# Patient Record
Sex: Female | Born: 1945 | Race: White | Hispanic: No | Smoking: Former smoker
Health system: Southern US, Community
[De-identification: ages and names within clinical notes are randomized; demographics above are authoritative.]

## PROBLEM LIST (undated history)

## (undated) DIAGNOSIS — D62 Acute posthemorrhagic anemia: Secondary | ICD-10-CM

## (undated) DIAGNOSIS — I10 Essential (primary) hypertension: Secondary | ICD-10-CM

## (undated) DIAGNOSIS — D126 Benign neoplasm of colon, unspecified: Secondary | ICD-10-CM

## (undated) DIAGNOSIS — Z87440 Personal history of urinary (tract) infections: Secondary | ICD-10-CM

## (undated) DIAGNOSIS — F329 Major depressive disorder, single episode, unspecified: Secondary | ICD-10-CM

## (undated) DIAGNOSIS — F419 Anxiety disorder, unspecified: Secondary | ICD-10-CM

## (undated) DIAGNOSIS — G629 Polyneuropathy, unspecified: Secondary | ICD-10-CM

## (undated) DIAGNOSIS — F1011 Alcohol abuse, in remission: Secondary | ICD-10-CM

## (undated) DIAGNOSIS — K7010 Alcoholic hepatitis without ascites: Secondary | ICD-10-CM

## (undated) DIAGNOSIS — Z87448 Personal history of other diseases of urinary system: Secondary | ICD-10-CM

## (undated) DIAGNOSIS — F32A Depression, unspecified: Secondary | ICD-10-CM

## (undated) DIAGNOSIS — M48061 Spinal stenosis, lumbar region without neurogenic claudication: Secondary | ICD-10-CM

## (undated) DIAGNOSIS — K269 Duodenal ulcer, unspecified as acute or chronic, without hemorrhage or perforation: Secondary | ICD-10-CM

## (undated) DIAGNOSIS — O009 Unspecified ectopic pregnancy without intrauterine pregnancy: Secondary | ICD-10-CM

## (undated) DIAGNOSIS — M5418 Radiculopathy, sacral and sacrococcygeal region: Secondary | ICD-10-CM

## (undated) DIAGNOSIS — N3020 Other chronic cystitis without hematuria: Secondary | ICD-10-CM

## (undated) DIAGNOSIS — N189 Chronic kidney disease, unspecified: Secondary | ICD-10-CM

## (undated) DIAGNOSIS — B3781 Candidal esophagitis: Secondary | ICD-10-CM

## (undated) DIAGNOSIS — D259 Leiomyoma of uterus, unspecified: Secondary | ICD-10-CM

## (undated) HISTORY — PX: OTHER SURGICAL HISTORY: SHX169

## (undated) HISTORY — PX: TONSILLECTOMY AND ADENOIDECTOMY: SUR1326

## (undated) HISTORY — PX: BACK SURGERY: SHX140

## (undated) HISTORY — DX: Essential (primary) hypertension: I10

## (undated) HISTORY — PX: COLONOSCOPY: SHX174

## (undated) HISTORY — PX: OOPHORECTOMY: SHX86

## (undated) HISTORY — DX: Candidal esophagitis: B37.81

## (undated) HISTORY — DX: Personal history of other diseases of urinary system: Z87.448

## (undated) HISTORY — PX: APPENDECTOMY: SHX54

## (undated) HISTORY — DX: Major depressive disorder, single episode, unspecified: F32.9

## (undated) HISTORY — DX: Other chronic cystitis without hematuria: N30.20

## (undated) HISTORY — DX: Unspecified ectopic pregnancy without intrauterine pregnancy: O00.90

## (undated) HISTORY — PX: SALPINGECTOMY: SHX328

## (undated) HISTORY — DX: Duodenal ulcer, unspecified as acute or chronic, without hemorrhage or perforation: K26.9

## (undated) HISTORY — DX: Depression, unspecified: F32.A

## (undated) HISTORY — DX: Radiculopathy, sacral and sacrococcygeal region: M54.18

## (undated) HISTORY — DX: Leiomyoma of uterus, unspecified: D25.9

## (undated) HISTORY — DX: Alcohol abuse, in remission: F10.11

## (undated) HISTORY — DX: Spinal stenosis, lumbar region without neurogenic claudication: M48.061

## (undated) HISTORY — DX: Acute posthemorrhagic anemia: D62

## (undated) HISTORY — DX: Chronic kidney disease, unspecified: N18.9

## (undated) HISTORY — DX: Anxiety disorder, unspecified: F41.9

## (undated) HISTORY — DX: Benign neoplasm of colon, unspecified: D12.6

## (undated) HISTORY — DX: Polyneuropathy, unspecified: G62.9

## (undated) HISTORY — DX: Personal history of urinary (tract) infections: Z87.440

## (undated) HISTORY — DX: Alcoholic hepatitis without ascites: K70.10

---

## 1996-10-20 ENCOUNTER — Encounter: Payer: Self-pay | Admitting: Internal Medicine

## 1996-10-21 ENCOUNTER — Encounter: Payer: Self-pay | Admitting: Internal Medicine

## 1997-10-21 ENCOUNTER — Other Ambulatory Visit: Admission: RE | Admit: 1997-10-21 | Discharge: 1997-10-21 | Payer: Self-pay | Admitting: Obstetrics and Gynecology

## 1998-02-04 ENCOUNTER — Encounter: Payer: Self-pay | Admitting: Internal Medicine

## 1998-11-23 ENCOUNTER — Other Ambulatory Visit: Admission: RE | Admit: 1998-11-23 | Discharge: 1998-11-23 | Payer: Self-pay | Admitting: Obstetrics and Gynecology

## 1999-03-14 ENCOUNTER — Encounter: Admission: RE | Admit: 1999-03-14 | Discharge: 1999-03-14 | Payer: Self-pay | Admitting: Sports Medicine

## 1999-03-14 ENCOUNTER — Encounter: Payer: Self-pay | Admitting: Sports Medicine

## 1999-04-04 ENCOUNTER — Encounter: Admission: RE | Admit: 1999-04-04 | Discharge: 1999-04-04 | Payer: Self-pay | Admitting: Sports Medicine

## 1999-05-09 ENCOUNTER — Encounter: Admission: RE | Admit: 1999-05-09 | Discharge: 1999-05-09 | Payer: Self-pay | Admitting: Sports Medicine

## 1999-11-23 ENCOUNTER — Other Ambulatory Visit: Admission: RE | Admit: 1999-11-23 | Discharge: 1999-11-23 | Payer: Self-pay | Admitting: Obstetrics and Gynecology

## 2000-12-24 ENCOUNTER — Other Ambulatory Visit: Admission: RE | Admit: 2000-12-24 | Discharge: 2000-12-24 | Payer: Self-pay | Admitting: Obstetrics and Gynecology

## 2001-12-26 ENCOUNTER — Other Ambulatory Visit: Admission: RE | Admit: 2001-12-26 | Discharge: 2001-12-26 | Payer: Self-pay | Admitting: Obstetrics and Gynecology

## 2003-05-06 ENCOUNTER — Ambulatory Visit (HOSPITAL_BASED_OUTPATIENT_CLINIC_OR_DEPARTMENT_OTHER): Admission: RE | Admit: 2003-05-06 | Discharge: 2003-05-06 | Payer: Self-pay | Admitting: Internal Medicine

## 2003-10-13 ENCOUNTER — Other Ambulatory Visit: Admission: RE | Admit: 2003-10-13 | Discharge: 2003-10-13 | Payer: Self-pay | Admitting: Obstetrics and Gynecology

## 2004-05-20 ENCOUNTER — Ambulatory Visit: Payer: Self-pay | Admitting: Internal Medicine

## 2004-05-21 ENCOUNTER — Encounter: Payer: Self-pay | Admitting: Internal Medicine

## 2004-05-21 ENCOUNTER — Ambulatory Visit (HOSPITAL_COMMUNITY): Admission: RE | Admit: 2004-05-21 | Discharge: 2004-05-21 | Payer: Self-pay | Admitting: Internal Medicine

## 2004-06-06 ENCOUNTER — Ambulatory Visit: Payer: Self-pay | Admitting: Internal Medicine

## 2004-10-06 ENCOUNTER — Ambulatory Visit: Payer: Self-pay | Admitting: Internal Medicine

## 2004-10-12 ENCOUNTER — Ambulatory Visit: Payer: Self-pay | Admitting: Internal Medicine

## 2004-10-14 ENCOUNTER — Other Ambulatory Visit: Admission: RE | Admit: 2004-10-14 | Discharge: 2004-10-14 | Payer: Self-pay | Admitting: Addiction Medicine

## 2005-10-26 ENCOUNTER — Other Ambulatory Visit: Admission: RE | Admit: 2005-10-26 | Discharge: 2005-10-26 | Payer: Self-pay | Admitting: Obstetrics and Gynecology

## 2006-01-16 ENCOUNTER — Ambulatory Visit: Payer: Self-pay | Admitting: Internal Medicine

## 2006-03-27 DIAGNOSIS — F1011 Alcohol abuse, in remission: Secondary | ICD-10-CM

## 2006-03-27 HISTORY — DX: Alcohol abuse, in remission: F10.11

## 2006-04-11 ENCOUNTER — Inpatient Hospital Stay (HOSPITAL_COMMUNITY): Admission: AD | Admit: 2006-04-11 | Discharge: 2006-04-17 | Payer: Self-pay | Admitting: Internal Medicine

## 2006-04-11 ENCOUNTER — Ambulatory Visit: Payer: Self-pay | Admitting: Internal Medicine

## 2006-04-11 ENCOUNTER — Encounter (INDEPENDENT_AMBULATORY_CARE_PROVIDER_SITE_OTHER): Payer: Self-pay | Admitting: *Deleted

## 2006-04-12 ENCOUNTER — Encounter: Payer: Self-pay | Admitting: Internal Medicine

## 2006-04-16 ENCOUNTER — Ambulatory Visit: Payer: Self-pay | Admitting: Internal Medicine

## 2006-07-03 ENCOUNTER — Ambulatory Visit: Payer: Self-pay | Admitting: Internal Medicine

## 2006-07-03 LAB — CONVERTED CEMR LAB
Alkaline Phosphatase: 56 units/L (ref 39–117)
Basophils Relative: 0.9 % (ref 0.0–1.0)
Eosinophils Relative: 1.8 % (ref 0.0–5.0)
HCT: 41 % (ref 36.0–46.0)
Hemoglobin: 14.6 g/dL (ref 12.0–15.0)
Lymphocytes Relative: 25 % (ref 12.0–46.0)
Monocytes Absolute: 0.5 10*3/uL (ref 0.2–0.7)
Neutro Abs: 6 10*3/uL (ref 1.4–7.7)
Neutrophils Relative %: 66.4 % (ref 43.0–77.0)
Total Bilirubin: 0.8 mg/dL (ref 0.3–1.2)
Total Protein: 6.8 g/dL (ref 6.0–8.3)
WBC: 9.1 10*3/uL (ref 4.5–10.5)

## 2006-10-16 ENCOUNTER — Other Ambulatory Visit: Admission: RE | Admit: 2006-10-16 | Discharge: 2006-10-16 | Payer: Self-pay | Admitting: Obstetrics and Gynecology

## 2007-01-29 ENCOUNTER — Telehealth: Payer: Self-pay | Admitting: Internal Medicine

## 2007-03-23 ENCOUNTER — Emergency Department (HOSPITAL_COMMUNITY): Admission: EM | Admit: 2007-03-23 | Discharge: 2007-03-23 | Payer: Self-pay | Admitting: Emergency Medicine

## 2007-03-27 ENCOUNTER — Encounter: Admission: RE | Admit: 2007-03-27 | Discharge: 2007-03-27 | Payer: Self-pay | Admitting: Neurological Surgery

## 2007-04-15 ENCOUNTER — Encounter: Admission: RE | Admit: 2007-04-15 | Discharge: 2007-04-15 | Payer: Self-pay | Admitting: Neurological Surgery

## 2007-05-26 HISTORY — PX: LUMBAR FUSION: SHX111

## 2007-05-27 ENCOUNTER — Inpatient Hospital Stay (HOSPITAL_COMMUNITY): Admission: RE | Admit: 2007-05-27 | Discharge: 2007-05-31 | Payer: Self-pay | Admitting: Neurological Surgery

## 2007-05-27 ENCOUNTER — Ambulatory Visit: Payer: Self-pay | Admitting: *Deleted

## 2007-06-12 ENCOUNTER — Encounter: Admission: RE | Admit: 2007-06-12 | Discharge: 2007-06-12 | Payer: Self-pay | Admitting: Neurological Surgery

## 2007-07-12 ENCOUNTER — Ambulatory Visit: Payer: Self-pay | Admitting: Internal Medicine

## 2007-07-12 ENCOUNTER — Telehealth: Payer: Self-pay | Admitting: Internal Medicine

## 2007-07-12 DIAGNOSIS — IMO0002 Reserved for concepts with insufficient information to code with codable children: Secondary | ICD-10-CM | POA: Insufficient documentation

## 2007-07-12 DIAGNOSIS — R209 Unspecified disturbances of skin sensation: Secondary | ICD-10-CM | POA: Insufficient documentation

## 2007-07-12 DIAGNOSIS — F341 Dysthymic disorder: Secondary | ICD-10-CM | POA: Insufficient documentation

## 2007-07-14 ENCOUNTER — Encounter: Payer: Self-pay | Admitting: Internal Medicine

## 2007-07-22 ENCOUNTER — Ambulatory Visit: Payer: Self-pay | Admitting: Internal Medicine

## 2007-08-02 ENCOUNTER — Telehealth: Payer: Self-pay | Admitting: Internal Medicine

## 2007-08-09 ENCOUNTER — Encounter: Payer: Self-pay | Admitting: Internal Medicine

## 2007-08-15 ENCOUNTER — Encounter: Payer: Self-pay | Admitting: Internal Medicine

## 2007-08-27 ENCOUNTER — Ambulatory Visit: Payer: Self-pay | Admitting: Psychology

## 2007-09-10 ENCOUNTER — Ambulatory Visit: Payer: Self-pay | Admitting: Psychology

## 2007-09-24 ENCOUNTER — Ambulatory Visit: Payer: Self-pay | Admitting: Psychology

## 2007-09-25 HISTORY — PX: MYOMECTOMY: SHX85

## 2007-10-09 ENCOUNTER — Encounter: Payer: Self-pay | Admitting: Internal Medicine

## 2007-10-10 ENCOUNTER — Encounter: Payer: Self-pay | Admitting: Obstetrics and Gynecology

## 2007-10-10 ENCOUNTER — Ambulatory Visit (HOSPITAL_BASED_OUTPATIENT_CLINIC_OR_DEPARTMENT_OTHER): Admission: RE | Admit: 2007-10-10 | Discharge: 2007-10-10 | Payer: Self-pay | Admitting: Obstetrics and Gynecology

## 2007-10-17 ENCOUNTER — Other Ambulatory Visit: Admission: RE | Admit: 2007-10-17 | Discharge: 2007-10-17 | Payer: Self-pay | Admitting: Obstetrics and Gynecology

## 2007-10-22 ENCOUNTER — Ambulatory Visit: Payer: Self-pay | Admitting: Psychology

## 2007-11-19 ENCOUNTER — Ambulatory Visit: Payer: Self-pay | Admitting: Psychology

## 2007-11-20 ENCOUNTER — Telehealth: Payer: Self-pay | Admitting: Internal Medicine

## 2007-11-28 ENCOUNTER — Ambulatory Visit: Payer: Self-pay | Admitting: Internal Medicine

## 2007-11-28 DIAGNOSIS — F1010 Alcohol abuse, uncomplicated: Secondary | ICD-10-CM | POA: Insufficient documentation

## 2007-12-11 ENCOUNTER — Ambulatory Visit: Payer: Self-pay | Admitting: Obstetrics and Gynecology

## 2007-12-11 ENCOUNTER — Other Ambulatory Visit: Admission: RE | Admit: 2007-12-11 | Discharge: 2007-12-11 | Payer: Self-pay | Admitting: Obstetrics and Gynecology

## 2007-12-17 ENCOUNTER — Ambulatory Visit: Payer: Self-pay | Admitting: Psychology

## 2008-01-08 ENCOUNTER — Encounter: Payer: Self-pay | Admitting: Internal Medicine

## 2008-01-14 ENCOUNTER — Ambulatory Visit: Payer: Self-pay | Admitting: Psychology

## 2008-02-11 ENCOUNTER — Ambulatory Visit: Payer: Self-pay | Admitting: Psychology

## 2008-02-11 ENCOUNTER — Ambulatory Visit: Payer: Self-pay | Admitting: Internal Medicine

## 2008-04-07 ENCOUNTER — Ambulatory Visit: Payer: Self-pay | Admitting: Psychology

## 2008-04-08 ENCOUNTER — Encounter: Payer: Self-pay | Admitting: Internal Medicine

## 2008-04-12 IMAGING — CR DG ABDOMEN ACUTE W/ 1V CHEST
3 series · 3 of 3 positions shown · non-contrast
Comparison: None.

CLINICAL DATA: 60 year-old-female with weakness, dehydration, vomiting. 
 ACUTE ABDOMEN WITH CHEST:

[w chest pa *]
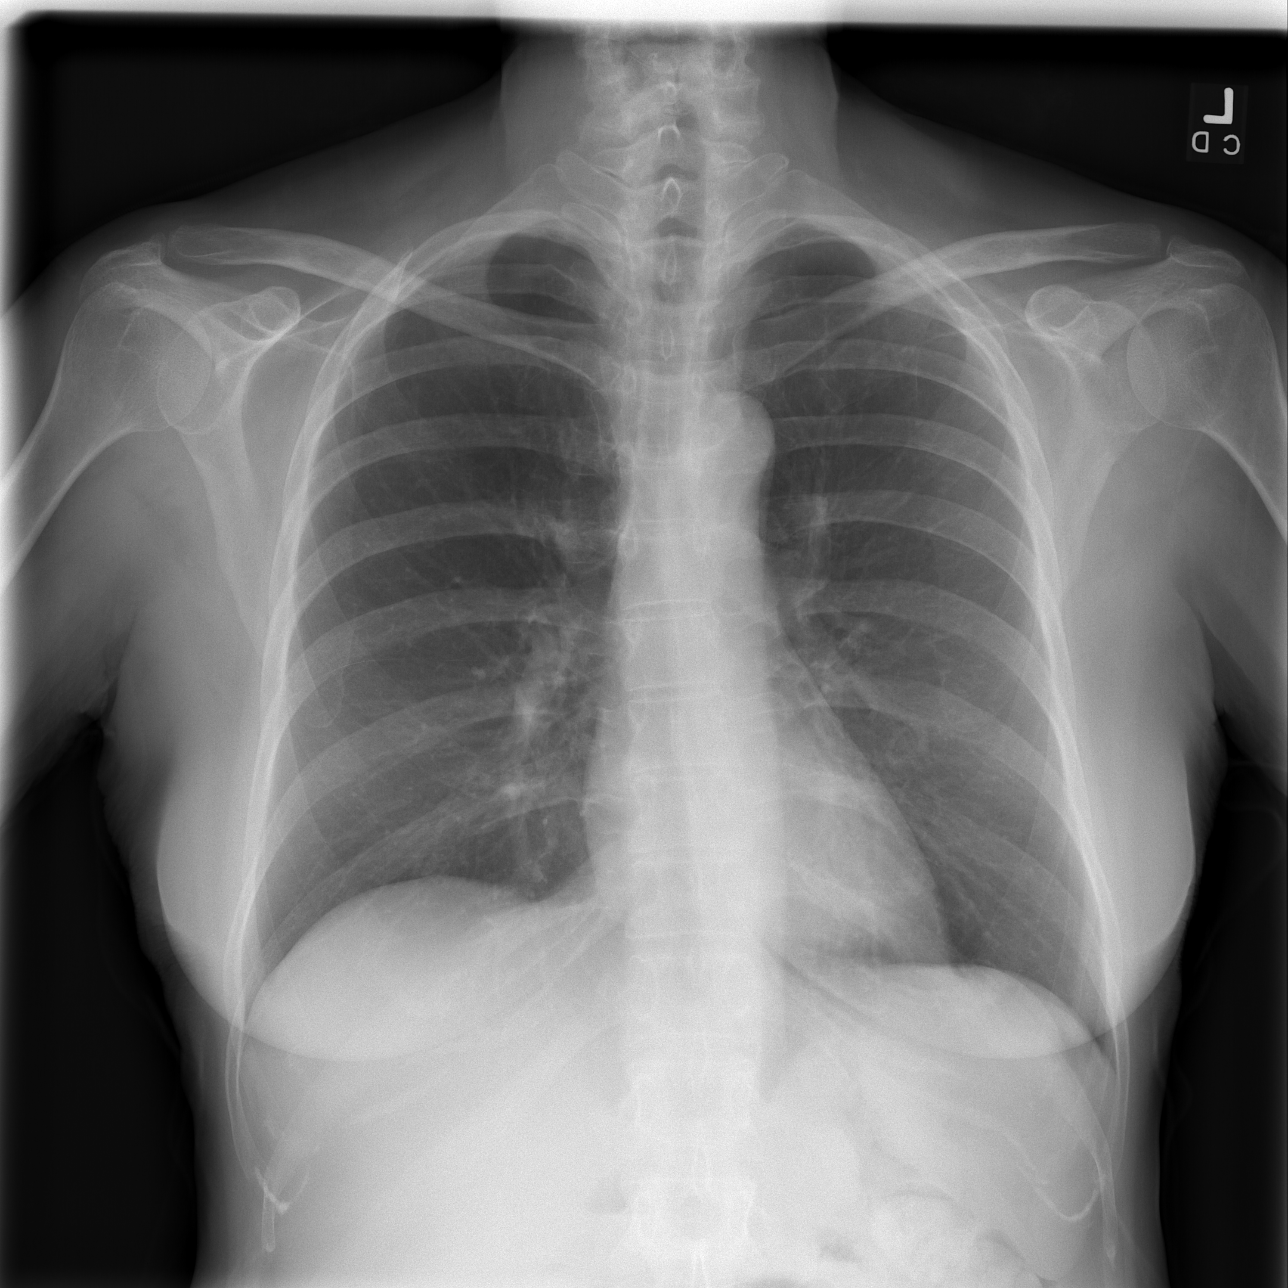

[w abdomen upright *]
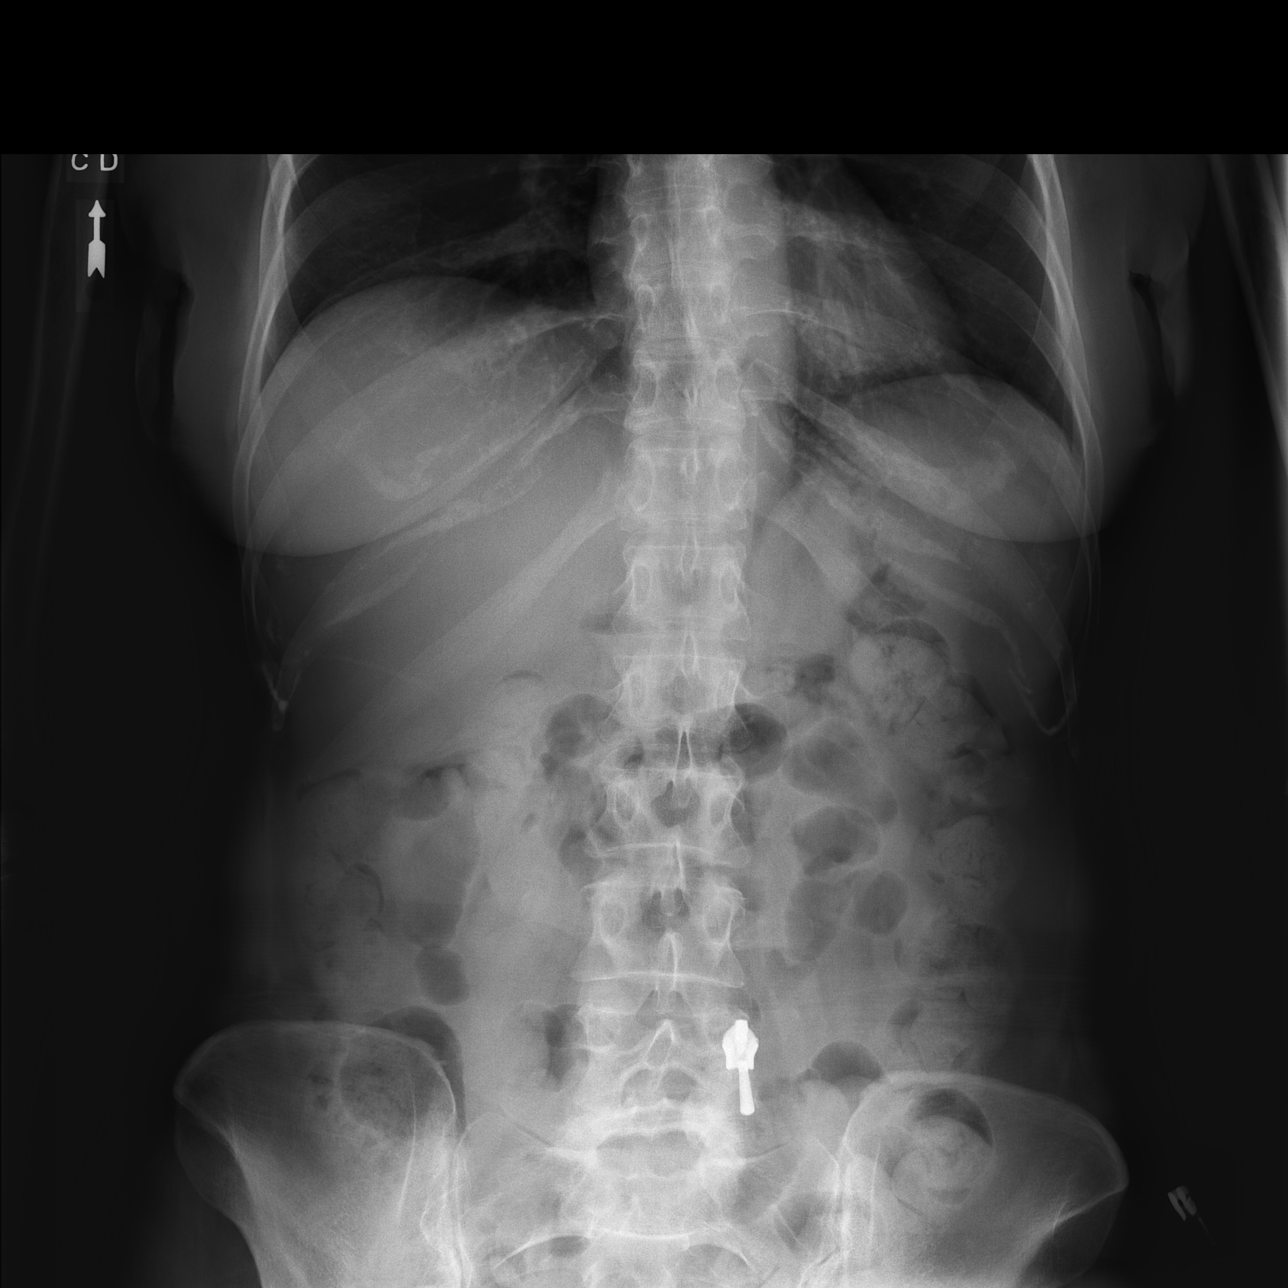

[t abdomen supine]
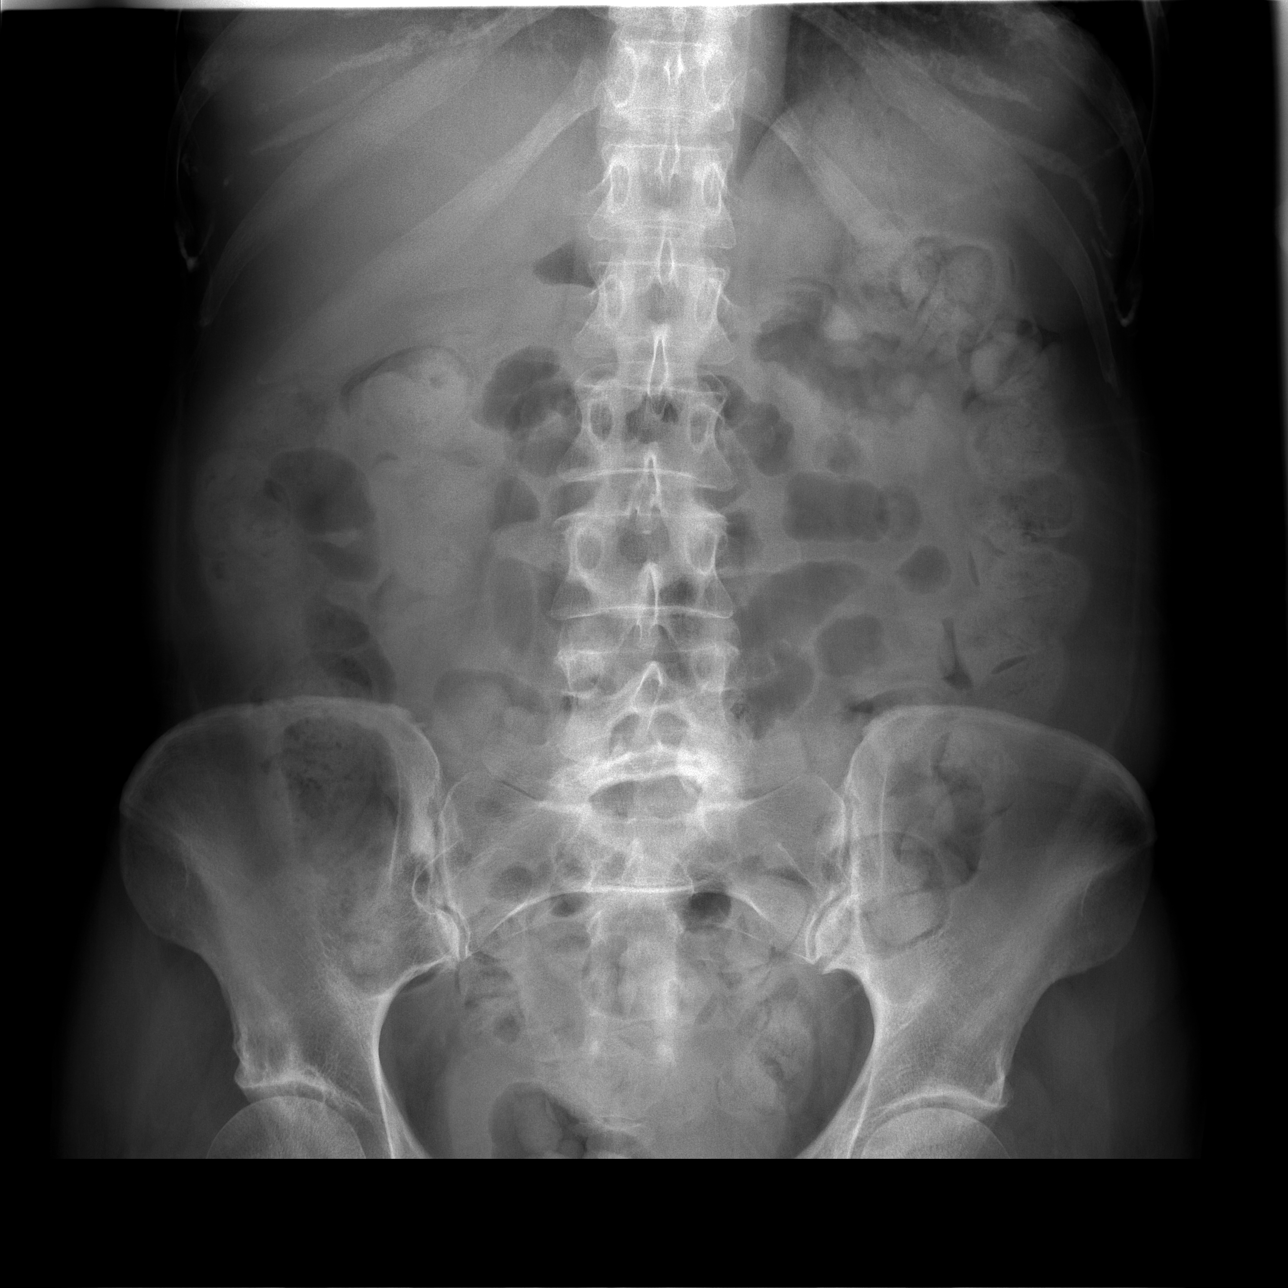

[3 of 3 positions shown; findings below may reference images not displayed]

FINDINGS: A single upright view of the chest demonstrates the cardiac silhouette, mediastinal and hilar contours to be within normal limits.  No acute pulmonary findings. 
 Two views of the abdomen demonstrate a moderate amount of stool throughout the colon suggesting constipation.  There are scattered loops of small bowel with air but no distention or air fluid levels.  No free air.  Soft tissue shadows of the abdomen are grossly maintained.  Bony structures are intact.
IMPRESSION: 1. No acute cardiopulmonary findings. 
 2.  Moderate constipation.

## 2008-06-15 ENCOUNTER — Ambulatory Visit: Payer: Self-pay | Admitting: Psychology

## 2008-06-18 ENCOUNTER — Telehealth: Payer: Self-pay | Admitting: Internal Medicine

## 2008-07-08 ENCOUNTER — Ambulatory Visit: Payer: Self-pay | Admitting: Psychology

## 2008-08-12 ENCOUNTER — Ambulatory Visit: Payer: Self-pay | Admitting: Psychology

## 2008-09-13 ENCOUNTER — Telehealth: Payer: Self-pay | Admitting: Internal Medicine

## 2008-09-15 ENCOUNTER — Ambulatory Visit: Payer: Self-pay | Admitting: Internal Medicine

## 2008-09-16 LAB — CONVERTED CEMR LAB
Alkaline Phosphatase: 63 units/L (ref 39–117)
BUN: 11 mg/dL (ref 6–23)
Basophils Absolute: 0 10*3/uL (ref 0.0–0.1)
Basophils Relative: 0 % (ref 0.0–3.0)
Bilirubin, Direct: 0.2 mg/dL (ref 0.0–0.3)
CO2: 28 meq/L (ref 19–32)
Chloride: 102 meq/L (ref 96–112)
Creatinine, Ser: 0.8 mg/dL (ref 0.4–1.2)
Eosinophils Absolute: 0 10*3/uL (ref 0.0–0.7)
HDL: 102 mg/dL (ref >39.00)
Lymphocytes Relative: 10.6 % — ABNORMAL LOW (ref 12.0–46.0)
MCHC: 35 g/dL (ref 30.0–36.0)
Neutrophils Relative %: 83.7 % — ABNORMAL HIGH (ref 43.0–77.0)
Nitrite: NEGATIVE
RBC: 4.53 M/uL (ref 3.87–5.11)
Total CHOL/HDL Ratio: 2
Total Protein: 7.2 g/dL (ref 6.0–8.3)
Urobilinogen, UA: 0.2 (ref 0.0–1.0)
VLDL: 19.6 mg/dL (ref 0.0–40.0)

## 2008-09-24 ENCOUNTER — Ambulatory Visit: Payer: Self-pay | Admitting: Internal Medicine

## 2008-09-24 LAB — CONVERTED CEMR LAB: Pap Smear: NORMAL

## 2008-09-26 LAB — CONVERTED CEMR LAB
Bilirubin Urine: NEGATIVE
Hemoglobin, Urine: NEGATIVE
Ketones, ur: NEGATIVE mg/dL
Leukocytes, UA: NEGATIVE
Nitrite: NEGATIVE
Specific Gravity, Urine: <=1.005
Total Protein, Urine: NEGATIVE mg/dL
Urine Glucose: NEGATIVE mg/dL
Urobilinogen, UA: 0.2
pH: 6

## 2008-10-01 ENCOUNTER — Other Ambulatory Visit: Admission: RE | Admit: 2008-10-01 | Discharge: 2008-10-01 | Payer: Self-pay | Admitting: Obstetrics and Gynecology

## 2008-10-01 ENCOUNTER — Encounter: Payer: Self-pay | Admitting: Obstetrics and Gynecology

## 2008-10-01 ENCOUNTER — Ambulatory Visit: Payer: Self-pay | Admitting: Obstetrics and Gynecology

## 2008-10-20 ENCOUNTER — Ambulatory Visit: Payer: Self-pay | Admitting: Obstetrics and Gynecology

## 2008-11-05 ENCOUNTER — Ambulatory Visit: Payer: Self-pay | Admitting: Internal Medicine

## 2008-11-05 LAB — CONVERTED CEMR LAB
ALT: 12 units/L (ref 0–35)
AST: 21 units/L (ref 0–37)
Bilirubin, Direct: 0.2 mg/dL (ref 0.0–0.3)
Hemoglobin, Urine: NEGATIVE
Total Bilirubin: 0.8 mg/dL (ref 0.3–1.2)
Total Protein, Urine: NEGATIVE mg/dL
Urine Glucose: NEGATIVE mg/dL
pH: 6 (ref 5.0–8.0)

## 2009-01-21 ENCOUNTER — Telehealth: Payer: Self-pay | Admitting: Internal Medicine

## 2009-02-04 ENCOUNTER — Ambulatory Visit: Payer: Self-pay | Admitting: Internal Medicine

## 2009-02-04 LAB — CONVERTED CEMR LAB
ALT: 15 units/L (ref 0–35)
AST: 23 units/L (ref 0–37)
Alkaline Phosphatase: 59 units/L (ref 39–117)
Bilirubin, Direct: 0.1 mg/dL (ref 0.0–0.3)
Total Bilirubin: 1 mg/dL (ref 0.3–1.2)

## 2009-02-17 ENCOUNTER — Telehealth: Payer: Self-pay | Admitting: Internal Medicine

## 2009-02-24 LAB — HM MAMMOGRAPHY: HM Mammogram: NORMAL

## 2009-02-26 ENCOUNTER — Encounter: Payer: Self-pay | Admitting: Internal Medicine

## 2009-05-10 ENCOUNTER — Telehealth: Payer: Self-pay | Admitting: Internal Medicine

## 2009-05-10 ENCOUNTER — Telehealth (INDEPENDENT_AMBULATORY_CARE_PROVIDER_SITE_OTHER): Payer: Self-pay | Admitting: *Deleted

## 2009-05-28 IMAGING — CR DG OR LOCAL ABDOMEN
1 series · 1 of 1 positions shown · non-contrast
Comparison: none

CLINICAL DATA: Anterior lower lumbar fusion.
 DIAGNOSTIC PORTABLE SUPINE ABDOMEN ? 05/27/07:

[AP]
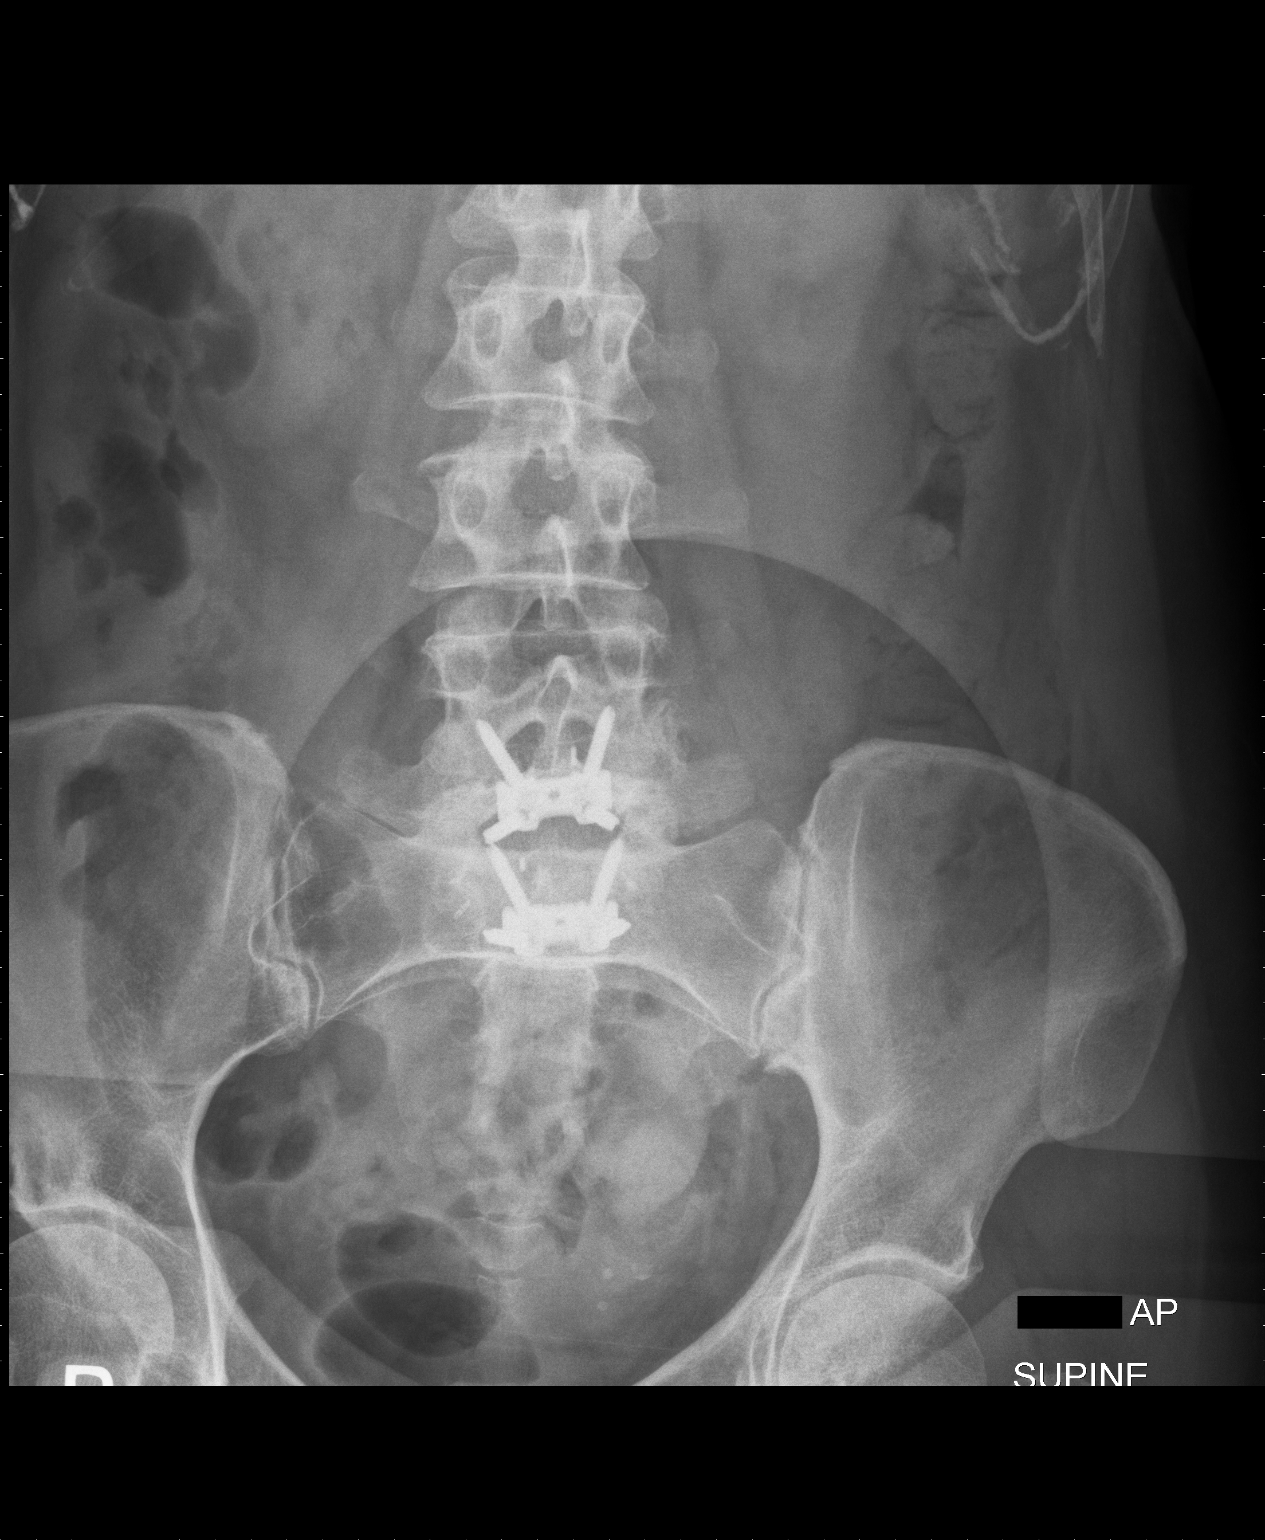

[1 of 1 positions shown; findings below may reference images not displayed]

FINDINGS: Fusion hardware is noted at L4-5 and L5-S1.  Hardware is noted at both of these levels.  No unexpected radiopaque foreign body.  Nonspecific bowel gas pattern.  No dilatation or obstruction.
IMPRESSION: Fusion hardware at the lumbosacral junction.   No unexpected radiopaque foreign body.

## 2009-06-08 ENCOUNTER — Telehealth: Payer: Self-pay | Admitting: Internal Medicine

## 2009-06-09 ENCOUNTER — Encounter: Payer: Self-pay | Admitting: Internal Medicine

## 2009-06-09 ENCOUNTER — Ambulatory Visit: Payer: Self-pay | Admitting: Internal Medicine

## 2009-06-09 LAB — CONVERTED CEMR LAB
Bilirubin Urine: NEGATIVE
Total Protein, Urine: NEGATIVE mg/dL
Urine Glucose: NEGATIVE mg/dL
pH: 5.5 (ref 5.0–8.0)

## 2009-06-10 ENCOUNTER — Telehealth: Payer: Self-pay | Admitting: Internal Medicine

## 2009-06-22 ENCOUNTER — Ambulatory Visit: Payer: Self-pay | Admitting: Cardiology

## 2009-06-22 DIAGNOSIS — I10 Essential (primary) hypertension: Secondary | ICD-10-CM | POA: Insufficient documentation

## 2009-06-22 DIAGNOSIS — I4949 Other premature depolarization: Secondary | ICD-10-CM | POA: Insufficient documentation

## 2009-07-07 ENCOUNTER — Ambulatory Visit: Payer: Self-pay | Admitting: Internal Medicine

## 2009-07-09 ENCOUNTER — Telehealth (INDEPENDENT_AMBULATORY_CARE_PROVIDER_SITE_OTHER): Payer: Self-pay | Admitting: *Deleted

## 2009-07-22 ENCOUNTER — Ambulatory Visit: Payer: Self-pay | Admitting: Cardiology

## 2009-07-22 ENCOUNTER — Ambulatory Visit: Payer: Self-pay

## 2009-07-22 ENCOUNTER — Encounter: Payer: Self-pay | Admitting: Cardiology

## 2009-07-22 ENCOUNTER — Ambulatory Visit (HOSPITAL_COMMUNITY): Admission: RE | Admit: 2009-07-22 | Discharge: 2009-07-22 | Payer: Self-pay | Admitting: Cardiology

## 2009-07-26 ENCOUNTER — Encounter: Payer: Self-pay | Admitting: Cardiology

## 2009-08-09 ENCOUNTER — Ambulatory Visit: Payer: Self-pay | Admitting: Internal Medicine

## 2009-08-10 ENCOUNTER — Telehealth: Payer: Self-pay | Admitting: Cardiology

## 2009-08-11 ENCOUNTER — Telehealth: Payer: Self-pay | Admitting: Internal Medicine

## 2009-08-15 ENCOUNTER — Encounter: Payer: Self-pay | Admitting: Internal Medicine

## 2009-08-16 LAB — CONVERTED CEMR LAB
ALT: 16 units/L (ref 0–35)
AST: 24 units/L (ref 0–37)
Albumin: 4.2 g/dL (ref 3.5–5.2)
Total Bilirubin: 0.5 mg/dL (ref 0.3–1.2)
Vitamin B-12: 361 pg/mL (ref 211–911)

## 2009-08-26 ENCOUNTER — Ambulatory Visit: Payer: Self-pay | Admitting: Cardiology

## 2009-08-27 ENCOUNTER — Telehealth: Payer: Self-pay | Admitting: Cardiology

## 2010-01-20 ENCOUNTER — Ambulatory Visit: Payer: Self-pay | Admitting: Internal Medicine

## 2010-01-20 LAB — CONVERTED CEMR LAB
Albumin: 4.2 g/dL (ref 3.5–5.2)
Basophils Relative: 0.4 % (ref 0.0–3.0)
Eosinophils Relative: 0.5 % (ref 0.0–5.0)
HCT: 38.2 % (ref 36.0–46.0)
Hemoglobin: 13.1 g/dL (ref 12.0–15.0)
Lymphs Abs: 1 10*3/uL (ref 0.7–4.0)
MCV: 103.2 fL — ABNORMAL HIGH (ref 78.0–100.0)
Monocytes Absolute: 0.4 10*3/uL (ref 0.1–1.0)
Monocytes Relative: 9.2 % (ref 3.0–12.0)
Neutro Abs: 3.3 10*3/uL (ref 1.4–7.7)
Prothrombin Time: 9.6 s — ABNORMAL LOW (ref 9.7–11.8)
Total Bilirubin: 0.6 mg/dL (ref 0.3–1.2)
WBC: 4.8 10*3/uL (ref 4.5–10.5)

## 2010-01-21 ENCOUNTER — Encounter (INDEPENDENT_AMBULATORY_CARE_PROVIDER_SITE_OTHER): Payer: Self-pay | Admitting: *Deleted

## 2010-01-21 DIAGNOSIS — Z87448 Personal history of other diseases of urinary system: Secondary | ICD-10-CM | POA: Insufficient documentation

## 2010-01-21 DIAGNOSIS — K589 Irritable bowel syndrome without diarrhea: Secondary | ICD-10-CM | POA: Insufficient documentation

## 2010-01-21 DIAGNOSIS — G4730 Sleep apnea, unspecified: Secondary | ICD-10-CM | POA: Insufficient documentation

## 2010-01-21 DIAGNOSIS — B3781 Candidal esophagitis: Secondary | ICD-10-CM | POA: Insufficient documentation

## 2010-01-21 DIAGNOSIS — K7689 Other specified diseases of liver: Secondary | ICD-10-CM | POA: Insufficient documentation

## 2010-01-21 DIAGNOSIS — Z8719 Personal history of other diseases of the digestive system: Secondary | ICD-10-CM | POA: Insufficient documentation

## 2010-01-21 LAB — CONVERTED CEMR LAB
Specific Gravity, Urine: 1.025 (ref 1.000–1.030)
Total Protein, Urine: NEGATIVE mg/dL
Urine Glucose: NEGATIVE mg/dL
Urobilinogen, UA: 2 (ref 0.0–1.0)
pH: 6 (ref 5.0–8.0)

## 2010-01-27 ENCOUNTER — Ambulatory Visit: Payer: Self-pay | Admitting: Internal Medicine

## 2010-01-31 ENCOUNTER — Telehealth (INDEPENDENT_AMBULATORY_CARE_PROVIDER_SITE_OTHER): Payer: Self-pay | Admitting: *Deleted

## 2010-02-03 ENCOUNTER — Ambulatory Visit: Payer: Self-pay | Admitting: Internal Medicine

## 2010-02-07 LAB — CONVERTED CEMR LAB
Urine Glucose: NEGATIVE mg/dL
Urobilinogen, UA: 0.2 (ref 0.0–1.0)

## 2010-02-21 ENCOUNTER — Ambulatory Visit: Payer: Self-pay | Admitting: Internal Medicine

## 2010-02-22 LAB — CONVERTED CEMR LAB
ALT: 62 units/L — ABNORMAL HIGH (ref 0–35)
AST: 82 units/L — ABNORMAL HIGH (ref 0–37)
Bilirubin, Direct: 0.1 mg/dL (ref 0.0–0.3)
Eosinophils Absolute: 0 10*3/uL (ref 0.0–0.7)
INR: 0.9 (ref 0.8–1.0)
MCHC: 35 g/dL (ref 30.0–36.0)
MCV: 101.5 fL — ABNORMAL HIGH (ref 78.0–100.0)
Monocytes Absolute: 0.5 10*3/uL (ref 0.1–1.0)
Neutrophils Relative %: 67.1 % (ref 43.0–77.0)
Platelets: 286 10*3/uL (ref 150.0–400.0)
Prothrombin Time: 9.6 s — ABNORMAL LOW (ref 9.7–11.8)
Total Bilirubin: 0.4 mg/dL (ref 0.3–1.2)
Total Protein: 7.6 g/dL (ref 6.0–8.3)
WBC: 6.7 10*3/uL (ref 4.5–10.5)

## 2010-03-28 ENCOUNTER — Ambulatory Visit: Payer: Self-pay | Admitting: Internal Medicine

## 2010-04-04 ENCOUNTER — Ambulatory Visit: Admit: 2010-04-04 | Payer: Self-pay | Admitting: Internal Medicine

## 2010-04-05 ENCOUNTER — Telehealth: Payer: Self-pay | Admitting: Internal Medicine

## 2010-04-06 ENCOUNTER — Encounter: Payer: Self-pay | Admitting: Internal Medicine

## 2010-04-06 ENCOUNTER — Telehealth: Payer: Self-pay | Admitting: Internal Medicine

## 2010-04-24 LAB — CONVERTED CEMR LAB
ALT: 55 units/L — ABNORMAL HIGH (ref 0–35)
AST: 53 units/L — ABNORMAL HIGH (ref 0–37)
Albumin: 4.7 g/dL (ref 3.5–5.2)
BUN: 17 mg/dL (ref 6–23)
Basophils Absolute: 0 10*3/uL (ref 0.0–0.1)
CO2: 30 meq/L (ref 19–32)
Chloride: 99 meq/L (ref 96–112)
GGT: 61 units/L — ABNORMAL HIGH (ref 7–51)
Glucose, Bld: 96 mg/dL (ref 70–99)
HCT: 45.1 % (ref 36.0–46.0)
Hemoglobin: 15.2 g/dL — ABNORMAL HIGH (ref 12.0–15.0)
Lymphs Abs: 2 10*3/uL (ref 0.7–4.0)
MCV: 102.5 fL — ABNORMAL HIGH (ref 78.0–100.0)
Monocytes Absolute: 0.6 10*3/uL (ref 0.1–1.0)
Monocytes Relative: 5.8 % (ref 3.0–12.0)
Neutro Abs: 7 10*3/uL (ref 1.4–7.7)
Platelets: 254 10*3/uL (ref 150.0–400.0)
Potassium: 4.3 meq/L (ref 3.5–5.1)
RDW: 12.9 % (ref 11.5–14.6)
Sodium: 137 meq/L (ref 135–145)
TSH: 1.97 microintl units/mL (ref 0.35–5.50)
Total Bilirubin: 1.2 mg/dL (ref 0.3–1.2)

## 2010-04-28 ENCOUNTER — Encounter: Payer: Self-pay | Admitting: Internal Medicine

## 2010-04-28 ENCOUNTER — Other Ambulatory Visit: Payer: Medicare Other

## 2010-04-28 ENCOUNTER — Ambulatory Visit: Payer: Medicare Other | Admitting: Internal Medicine

## 2010-04-28 ENCOUNTER — Encounter (INDEPENDENT_AMBULATORY_CARE_PROVIDER_SITE_OTHER): Payer: Self-pay | Admitting: *Deleted

## 2010-04-28 DIAGNOSIS — F1010 Alcohol abuse, uncomplicated: Secondary | ICD-10-CM

## 2010-04-28 DIAGNOSIS — F341 Dysthymic disorder: Secondary | ICD-10-CM

## 2010-04-28 DIAGNOSIS — I10 Essential (primary) hypertension: Secondary | ICD-10-CM

## 2010-04-28 DIAGNOSIS — K7689 Other specified diseases of liver: Secondary | ICD-10-CM

## 2010-04-28 NOTE — Progress Notes (Signed)
  Phone Note Call from Patient Call back at [phone]   Caller: Patient Call For: Dr. Juanda Chance Summary of Call: Asking to speak directly to Dr. Juanda Chance...would not leave any details Initial call taken by: Karna Christmas,  January 31, 2010 2:33 PM    [Prescriptions]  Received a call from patient's daughter Amil Amen. Explained to daughter that I cannot give her any information about her mother and Dr. Juanda Chance cannot discuss her mother with her.

## 2010-04-28 NOTE — Miscellaneous (Signed)
Summary: Cozaar  Clinical Lists Changes  Medications: Added new medication of COZAAR 50 MG TABS (LOSARTAN POTASSIUM) take one tab by mouth once daily - Signed Rx of COZAAR 50 MG TABS (LOSARTAN POTASSIUM) take one tab by mouth once daily;  #30 x 6;  Signed;  Entered by: Sherri Rad, RN, BSN;  Authorized by: Lenoria Farrier, MD, [facility];  Method used: Electronically to [facility] [address] [place], [place]  [address], Ph: [phone] or [phone], Fax: [phone]    Prescriptions: COZAAR 50 MG TABS (LOSARTAN POTASSIUM) take one tab by mouth once daily  #30 x 6   Entered by:   Sherri Rad, RN, BSN   Authorized by:   Lenoria Farrier, MD, [facility]   Signed by:   Sherri Rad, RN, BSN on 07/26/2009   Method used:   Electronically to        [facility]* (retail)       [address]       [place], [place]  [address]       Ph: [phone] or [phone]       Fax: [phone]   RxID:   [id number]

## 2010-04-28 NOTE — Progress Notes (Signed)
Summary: Lorazepam  Phone Note Refill Request Message from:  Fax from Pharmacy on Aug 11, 2009 11:37 AM  Refills Requested: Medication #1:  ATIVAN 0.5 MG  TABS 1 q 6 as needed anxiety & tremors stop valium  # 100   Last Refilled: 07/12/2009 Leonie Douglas [phone] Last ov 07/07/09  Next Appointment Scheduled: none Initial call taken by: Orlan Leavens,  Aug 11, 2009 11:38 AM  Follow-up for Phone Call        Is this ok to refill? Follow-up by: Orlan Leavens,  Aug 11, 2009 11:39 AM  Additional Follow-up for Phone Call Additional follow up Details #1::        OK to refill x 5 Additional Follow-up by: Jacques Navy MD,  Aug 11, 2009 1:00 PM    Additional Follow-up for Phone Call Additional follow up Details #2::    Notified pharm spoke with David/pharmacist ok # 100 with 5 addtional refills Follow-up by: Orlan Leavens,  Aug 11, 2009 2:51 PM  Prescriptions: ATIVAN 0.5 MG  TABS (LORAZEPAM) 1 q 6 as needed anxiety & tremors stop valium  #100 x 5   Entered by:   Orlan Leavens   Authorized by:   Jacques Navy MD   Signed by:   Orlan Leavens on 08/11/2009   Method used:   Telephoned to ...       [facility] (retail)       [address]       [place], [place]  [id number]       Ph: [phone] or [phone]       Fax: [phone]   RxID:   [id number]

## 2010-04-28 NOTE — Assessment & Plan Note (Signed)
Summary: per dr brodie/dn    History of Present Illness Visit Type: Follow-up Visit Primary GI MD: Paige Sar MD Primary Paige Black: Paige Black Chief Complaint: UTI/ flu shot needed History of Present Illness:   This is a 65 year old white female with abnormal liver function tests. She is an alcoholic and has been drinking excessive liquor as well as wine. She is a personal friend. Her children called me with concerns about her drinking. They describe her shaking and being confused. She went through alcohol rehabilitation approximately 3 years ago. Her liver function tests have been checked every 6 months and were normal until May 2011. The last set of liver function tests last week showed elevation of her AST to 95 and ALT to 84 with a normal albumin of 4.2 and INR of 0.9. Her MCV was elevated at 103. She admits to drinking in excess. She denies any swelling. Additional medical problems include history of pyelonephritis, high blood pressure and slipped lumbosacral disc necessitating surgery by Dr. Danielle Black.   GI Review of Systems      Denies abdominal pain, acid reflux, belching, bloating, chest pain, dysphagia with liquids, dysphagia with solids, heartburn, loss of appetite, nausea, vomiting, vomiting blood, weight loss, and  weight gain.      Reports liver problems.     Denies anal fissure, black tarry stools, change in bowel habit, constipation, diarrhea, diverticulosis, fecal incontinence, heme positive stool, hemorrhoids, irritable bowel syndrome, jaundice, light color stool, rectal bleeding, and  rectal pain.    Current Medications (verified): 1)  Gabapentin 100 Mg  Caps (Gabapentin) .Marland Kitchen.. 1 By Mouth Two Times A Day 2)  Sertraline Hcl 100 Mg  Tabs (Sertraline Hcl) .Marland Kitchen.. 1 By Mouth Once Daily 3)  Ativan 0.5 Mg  Tabs (Lorazepam) .Marland Kitchen.. 1 Q 6 As Needed Anxiety & Tremors Stop Valium 4)  Cozaar 100 Mg Tabs (Losartan Potassium) .... Take One Tab By Mouth Once Daily 5)  Ciprofloxacin Hcl 500  Mg Tabs (Ciprofloxacin Hcl) .... Take One By Mouth Two Times A Day  Allergies (verified): 1)  ! Pcn 2)  ! Sulfa  Past History:  Past Medical History: Reviewed history from 11/28/2007 and no changes required. Anxiety Depression Hypertension Alcohol abuse, h/o - 28 days at [facility] '08 Hepatitis - alcohol related. pyelonephritis, h/o recurrent cystitis, h/o spinal stenosis-lumbar; congenital anomalous L6 vertebra Right radiculopathy S1-5 with weakness and paresthesia right foot Uterine fibroids    Physician Roster:                  Gyn - Paige Black                  NS -   Paige Black                  GI -    Paige Black  Past Surgical History: Reviewed history from 01/21/2010 and no changes required. Appendectomy-18 Tonsillectomy-remote laproscopic salpingectomy-left for ectopic pregnancy lumbar fusion L5-6, L6-S1  March '09 Myomectomy for uterine fibroids July '09 Renal Artery Stent Placement  G6P3  1 TAB, 1 ectopic, 1 SAB  Family History: Reviewed history from 01/21/2010 and no changes required. father - deceased @ 34: CAD/MI, CHF, AAA, HTN, Dementia mother - '23: DM, HTN PAunt - breast cancer Brother - killed in [place] Neg - colon cancer Family History of Irritable Bowel Syndrome: Sister Family History of Diabetes: Mother, Father  Social History: Reviewed history from 01/21/2010 and no changes required. Paige BA, Paige Black Was Married -  ___yrs divorced 3 daughters: Paige Black and Paige Black. Her oldest has been diagnosed with breast cancer (DCIS) but is doing well with treatment work: travel agent Lives alone, $$ stressed history of alcohol excess  Review of Systems       Pertinent positive and negative review of systems were noted in the above HPI. All other ROS was otherwise negative.   Vital Signs:  Patient profile:   65 year old female Height:      67 inches Weight:      142.13 pounds BMI:     22.34 Pulse rate:   60 / minute Pulse rhythm:   regular BP  sitting:   142 / 98  (left arm) Cuff size:   regular  Vitals Entered By: Paige Black CMA Duncan Dull) (January 27, 2010 9:12 AM)  Physical Exam  General:  Well developed, well nourished, no acute distress. Eyes:  nonicteric. Mouth:  No deformity or lesions, dentition normal. Neck:  Supple; no masses or thyromegaly. Lungs:  Clear throughout to auscultation. Heart:  Regular rate and rhythm; no murmurs, rubs,  or bruits. Abdomen:  soft abdomen with enlarged liver. Liver edge 2 centimeters below right costal margin and is nontender. Splenic tip not palpable. No ascites. Rectal:  soft Hemoccult negative stool. Extremities:  no edema. Neurologic:  no asterixis. Fine tremor. Skin:  Paige Black erythema. Psych:  Alert and cooperative. Normal mood and affect.   Impression & Recommendations:  Problem # 1:  LIVER FUNCTION TESTS, ABNORMAL, HX OF (ICD-V12.2) Patient has mild abnormalities of liver function tests associated with mild hepatomegaly almost certainly due to excessive alcohol intake. I had a long talk with Paige Black today concerning her abstinence. I am concerned that she cannot stop abruptly due to possible DT's. She will cut out the liquor first, then reduce the wine intake.   Problem # 2:  IRRITABLE BOWEL SYNDROME (ICD-564.1) Patient is currently having no symptoms of this. She is having dyspepsia and belching in the mornings likely due to alcohol-related gastritis. I have given her samples of Prilosec 20 mg to take daily.  Problem # 3:  ALCOHOLIC HEPATITIS, HX OF (ICD-V12.79) Paitent is a recovering alcoholic. I have discussed the possibility of going back to [facility]. She says she will try to reduce drinking on her own.  Patient Instructions: 1)  You have been given your influenza injection today. 2)  Please pick up your prescriptions at the pharmacy. Electronic prescription(s) has already been sent for Cipro and Prilosec. 3)  Be sure to return for lab work four (4) weeks from the date  of your last labwork (02/21/10). LFT's 4)  refill of Cipro, repeat U/A  next week  5)  Recall colonoscopy 2012. 6)  Copy sent to : Dr Paige Black 7)  The medication list was reviewed and reconciled.  All changed / newly prescribed medications were explained.  A complete medication list was provided to the patient / caregiver. Prescriptions: PRILOSEC 20 MG CPDR (OMEPRAZOLE) Take 1 tablet by mouth once a day  #30 x 1   Entered by:   Lamona Curl CMA (AAMA)   Authorized by:   Hart Carwin MD   Signed by:   Lamona Curl CMA (AAMA) on 01/27/2010   Method used:   Electronically to        [facility] (retail)       [address]       [place], [place]  [address]       Ph:  [phone] or [phone]       Fax: [phone]   RxID:   [id number] CIPROFLOXACIN HCL 500 MG TABS (CIPROFLOXACIN HCL) Take one by mouth two times a day  #14 x 0   Entered by:   Lamona Curl CMA (AAMA)   Authorized by:   Hart Carwin MD   Signed by:   Lamona Curl CMA (AAMA) on 01/27/2010   Method used:   Electronically to        [facility] (retail)       [address]       [place], [place]  [address]       Ph: [phone] or [phone]       Fax: [phone]   RxID:   [id number]    Immunizations Administered:  Influenza Vaccine # 1:    Vaccine Type: Fluvirin    Site: left deltoid    Mfr: Novartis    Dose: 0.5 ml    Route: IM    Given by: Lamona Curl CMA (AAMA)    Exp. Date: 08/2010    Lot #: [id number]    VIS given: 10/19/09 version given January 27, 2010.  Flu Vaccine Consent Questions:    Do you have a history of severe allergic reactions to this vaccine? no    Any prior history of allergic reactions to egg and/or gelatin? no    Do you have a sensitivity to the preservative Thimersol? no    Do you have a past history of Guillan-Barre Syndrome? no    Do you currently have an acute febrile illness? no    Have you ever had a  severe reaction to latex? no    Vaccine information given and explained to patient? yes    Are you currently pregnant? no

## 2010-04-28 NOTE — Assessment & Plan Note (Signed)
Summary: BP check  Nurse Visit   Vital Signs:  Patient profile:   65 year old female Weight:      139 pounds Pulse rate:   65 / minute BP sitting:   148 / 90  (right arm) Cuff size:   regular  Vitals Entered By: Ollen Gross, RN, BSN (August 26, 2009 10:51 AM)  Impression & Recommendations:  Problem # 1:  HYPERTENSION, BENIGN (ICD-401.1)  Her updated medication list for this problem includes:    Cozaar 100 Mg Tabs (Losartan potassium) .Marland Kitchen... Take one tab by mouth once daily Pt. in for B/P check. Cozaar dose changed from 50mg  to 100 mg once a day on 08/10/09. Today's B/P take manualy. Right arm 148/90, left arm 142/ 92, pulse 65 b/min RR 20. Pt took medication this AM. No C/O at this time. Dr. Marikay Alar DOD recommedsfor pt. to be f/u by MD. Pt aware.    Allergies: 1)  ! Pcn 2)  ! Sulfa

## 2010-04-28 NOTE — Progress Notes (Signed)
Summary: RF - ATIVAN NEEDS OV  Phone Note Refill Request Message from:  Fax from Pharmacy on April 05, 2010 2:12 PM  Refills Requested: Medication #1:  ATIVAN 0.5 MG  TABS 1 q 6 as needed anxiety & tremors stop valium fax from [facility], please Advise refills  Initial call taken by: Ami Bullins CMA,  April 05, 2010 2:13 PM  Follow-up for Phone Call        ok to refill x 3. Needs an OV in the next 1-2 months Follow-up by: Jacques Navy MD,  April 05, 2010 6:20 PM  Additional Follow-up for Phone Call Additional follow up Details #1::        PER MD give only one mth then she must come in for office visit.  Additional Follow-up by: Lamar Sprinkles, CMA,  April 06, 2010 9:25 AM    Prescriptions: ATIVAN 0.5 MG  TABS (LORAZEPAM) 1 q 6 as needed anxiety & tremors stop valium  #100 x 0   Entered by:   Lamar Sprinkles, CMA   Authorized by:   Jacques Navy MD   Signed by:   Lamar Sprinkles, CMA on 04/06/2010   Method used:   Telephoned to ...       [facility] (retail)       [address]       [place], [place]  [address]       Ph: [phone] or [phone]       Fax: [phone]   RxID:   [id number]

## 2010-04-28 NOTE — Progress Notes (Signed)
Summary: check on rx's   Phone Note Call from Patient Call back at Home Phone [phone]   Caller: Patient Call For: Dr. Juanda Chance Reason for Call: Talk to Nurse Summary of Call: 1. pt says she has referred [name] and Tommie has sch'ed a COL for 3/3... wanted "[name] to know and have a heads up b/c we are really good friends, both [name] and Tommie" 2. pt also would like to check up on rx for Sertraline and Gabapentin... [facility]  Initial call taken by: Vallarie Mare,  May 10, 2009 10:24 AM  Follow-up for Phone Call        Patient has her gabapentin and sertraline rx'ed through Dr Debby Bud. She will need to get refills from their office. Left message on patient's voicemail to advise her of this. Follow-up by: Hortense Ramal CMA Duncan Dull),  May 10, 2009 11:13 AM  Additional Follow-up for Phone Call Additional follow up Details #1::        have spoken to the pt. and to Dr Norin's nurse to send refills to [facility] Additional Follow-up by: Hart Carwin MD,  May 10, 2009 12:56 PM    Prescriptions: SERTRALINE HCL 100 MG  TABS (SERTRALINE HCL) 1 by mouth once daily  #30 Tablet x 3   Entered by:   Ami Bullins CMA   Authorized by:   Jacques Navy MD   Signed by:   Bill Salinas CMA on 05/10/2009   Method used:   Electronically to        [facility] (retail)       [address]       [place], [place]  [address]       Ph: [phone] or [phone]       Fax: [phone]   RxID:   [id number] GABAPENTIN 100 MG  CAPS (GABAPENTIN) Take 2 tablets by mouth three times a day (please deliver)  #180 x 2   Entered by:   Bill Salinas CMA   Authorized by:   Jacques Navy MD   Signed by:   Bill Salinas CMA on 05/10/2009   Method used:   Electronically to        [facility] (retail)       [address]       [place], [place]  [address]       Ph: [phone] or [phone]       Fax: [phone]   RxID:   [id number]

## 2010-04-28 NOTE — Miscellaneous (Signed)
  Clinical Lists Changes  Medications: Added new medication of CIPROFLOXACIN HCL 500 MG TABS (CIPROFLOXACIN HCL) Take one by mouth two times a day - Signed Rx of CIPROFLOXACIN HCL 500 MG TABS (CIPROFLOXACIN HCL) Take one by mouth two times a day;  #14 x 0;  Signed;  Entered by: Jesse Fall RN;  Authorized by: Hart Carwin MD;  Method used: Electronically to [facility] [address], [place], [place]  [id number], Ph: [phone] or [phone], Fax: [phone]    Prescriptions: CIPROFLOXACIN HCL 500 MG TABS (CIPROFLOXACIN HCL) Take one by mouth two times a day  #14 x 0   Entered by:   Jesse Fall RN   Authorized by:   Hart Carwin MD   Signed by:   Jesse Fall RN on 01/21/2010   Method used:   Electronically to        [facility] (retail)       [address]       [place], [place]  [id number]       Ph: [phone] or [phone]       Fax: [phone]   RxID:   [id number]

## 2010-04-28 NOTE — Assessment & Plan Note (Signed)
Summary: uti? sorethroat, "shakes" / SD   Vital Signs:  Patient profile:   65 year old female Height:      67 inches Weight:      140 pounds BMI:     22.01 O2 Sat:      97 % on Room air Temp:     98.9 degrees F oral Pulse rate:   84 / minute Pulse rhythm:   irregularly irregular Resp:     16 per minute BP sitting:   138 / 84  (left arm) Cuff size:   large  Vitals Entered By: Rock Nephew CMA (June 09, 2009 3:44 PM)  O2 Flow:  Room air CC: bodyache, fatigue, sore thrat, URI symptoms Is Patient Diabetic? No   Primary Care Provider:  Norins  CC:  bodyache, fatigue, sore thrat, and URI symptoms.  History of Present Illness:  URI Symptoms      This is a 65 year old woman who presents with URI symptoms.  The symptoms began 4 days ago.  The severity is described as mild.  The patient reports sore throat, but denies nasal congestion, clear nasal discharge, purulent nasal discharge, dry cough, productive cough, earache, and sick contacts.  Associated symptoms include low-grade fever (<100.5 degrees).  The patient denies stiff neck, dyspnea, wheezing, rash, vomiting, diarrhea, use of an antipyretic, and response to antipyretic.  The patient also reports muscle aches.  The patient denies headache and severe fatigue.  The patient denies the following risk factors for Strep sinusitis: unilateral facial pain, unilateral nasal discharge, Strep exposure, and tender adenopathy.    She was told during an exan for disability in Dec. 2010 that she had an arrhythmia.  Preventive Screening-Counseling & Management  Alcohol-Tobacco     Alcohol drinks/day: 3     Alcohol type: wine     >5/day in last 3 mos: no     Alcohol Counseling: to STOP drinking     Feels need to cut down: yes     Feels annoyed by complaints: yes     Feels guilty re: drinking: yes     Needs 'eye opener' in am: no     Smoking Status: never  Hep-HIV-STD-Contraception     Hepatitis Risk: no risk noted     HIV Risk: no  risk noted     STD Risk: no risk noted      Sexual History:  currently monogamous.    Clinical Review Panels:  Diabetes Management   HgBA1C:  5.8 (09/15/2008)   Creatinine:  0.8 (09/15/2008)   Last Flu Vaccine:  Fluvax 3+ (02/04/2009)   Last Pneumovax:  Pneumovax (02/11/2008)  CBC   WBC:  7.0 (09/15/2008)   RBC:  4.53 (09/15/2008)   Hgb:  16.0 (09/15/2008)   Hct:  45.7 (09/15/2008)   Platelets:  199.0 (09/15/2008)   MCV  101.0 (09/15/2008)   MCHC  35.0 (09/15/2008)   RDW  13.2 (09/15/2008)   PMN:  83.7 (09/15/2008)   Lymphs:  10.6 (09/15/2008)   Monos:  5.2 (09/15/2008)   Eosinophils:  0.5 (09/15/2008)   Basophil:  0.0 (09/15/2008)  Complete Metabolic Panel   Glucose:  79 (09/15/2008)   Sodium:  140 (09/15/2008)   Potassium:  3.9 (09/15/2008)   Chloride:  102 (09/15/2008)   CO2:  28 (09/15/2008)   BUN:  11 (09/15/2008)   Creatinine:  0.8 (09/15/2008)   Albumin:  4.4 (02/04/2009)   Total Protein:  7.4 (02/04/2009)   Calcium:  9.5 (09/15/2008)   Total  Bili:  1.0 (02/04/2009)   Alk Phos:  59 (02/04/2009)   SGPT (ALT):  15 (02/04/2009)   SGOT (AST):  23 (02/04/2009)   Medications Prior to Update: 1)  Gabapentin 100 Mg  Caps (Gabapentin) .... Take 2 Tablets By Mouth Three Times A Day (Please Deliver) 2)  Sertraline Hcl 100 Mg  Tabs (Sertraline Hcl) .Marland Kitchen.. 1 By Mouth Once Daily 3)  Ativan 0.5 Mg  Tabs (Lorazepam) .Marland Kitchen.. 1 Q 6 As Needed Anxiety & Tremors Stop Valium 4)  Premarin 0.625 Mg/gm Crea (Estrogens, Conjugated) .... 0.5 G Pv Three Times A Day. 5)  Sertraline Hcl 25 Mg Tabs (Sertraline Hcl) .Marland Kitchen.. 1 By Mouth Once Daily (Along With 100mg ) 6)  Cipro 250 Mg Tabs (Ciprofloxacin Hcl) .... Take 1 Tablet By Mouth Two Times A Day X 7 Days...then Go To [place] [facility] For Repeat Urinalysis!  Current Medications (verified): 1)  Gabapentin 100 Mg  Caps (Gabapentin) .... Take 2 Tablets By Mouth Three Times A Day (Please Deliver) 2)  Sertraline Hcl 100 Mg  Tabs (Sertraline Hcl) .Marland Kitchen.. 1  By Mouth Once Daily 3)  Ativan 0.5 Mg  Tabs (Lorazepam) .Marland Kitchen.. 1 Q 6 As Needed Anxiety & Tremors Stop Valium 4)  Cipro 250 Mg Tabs (Ciprofloxacin Hcl) .... Take 1 Tablet By Mouth Two Times A Day X 7 Days...then Go To [place] [facility] For Repeat Urinalysis!  Allergies (verified): 1)  ! Pcn 2)  ! Sulfa  Past History:  Past Medical History: Reviewed history from 11/28/2007 and no changes required. Anxiety Depression Hypertension Alcohol abuse, h/o - 28 days at [facility] '08 Hepatitis - alcohol related. pyelonephritis, h/o recurrent cystitis, h/o spinal stenosis-lumbar; congenital anomalous L6 vertebra Right radiculopathy S1-5 with weakness and paresthesia right foot Uterine fibroids    Physician Roster:                  Gyn - Gottsegen                  NS -   Elsner                  GI -    D. Juanda Chance  Past Surgical History: Reviewed history from 11/28/2007 and no changes required. Appendectomy-18 Tonsillectomy-remote laproscopic salpingectomy-left for ectopic pregnancy lumbar fusion L5-6, L6-S1  March '09 Myomectomy for uterine fibroids July '09   G6P3  1 TAB, 1 ectopic, 1 SAB  Family History: Reviewed history from 07/12/2007 and no changes required. father - deceased @ 73: CAD/MI, CHF, AAA, HTN, Dementia mother - '23: DM, HTN PAunt - breast cancer Brother - killed in [place] Neg - colon cancer  Social History: Reviewed history from 07/12/2007 and no changes required. 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   CV:  Denies bluish  discoloration of lips or nails, chest pain or discomfort, difficulty breathing at night, difficulty breathing while lying down, fainting, fatigue, leg cramps with exertion, lightheadness, near fainting, palpitations, shortness of breath with exertion, swelling of feet, and weight gain. Psych:  Complains of anxiety; denies alternate hallucination ( auditory/visual), depression, easily angered, easily tearful, irritability,  mental problems, panic attacks, sense of great danger, suicidal thoughts/plans, thoughts of violence, unusual visions or sounds, and thoughts /plans of harming others.  Physical Exam  General:  alert, well-developed, well-nourished, well-hydrated, and normal appearance.   Head:  normocephalic.   Eyes:  no icterus. vision grossly intact, pupils equal, pupils round, and pupils reactive to light.   Mouth:  Oral mucosa and oropharynx without lesions or exudates.  Teeth in good repair. Neck:  supple, full ROM, no masses, no thyromegaly, no JVD, normal carotid upstroke, no carotid bruits, and no cervical lymphadenopathy.   Lungs:  Normal respiratory effort, chest expands symmetrically. Lungs are clear to auscultation, no crackles or wheezes. Heart:  normal rate, no murmur, no gallop, no rub, no JVD, and irregular rhythm.   Abdomen:  Bowel sounds positive,abdomen soft and non-tender without masses, organomegaly or hernias noted. Msk:  No deformity or scoliosis noted of thoracic or lumbar spine.   Pulses:  R and L carotid,radial,femoral,dorsalis pedis and posterior tibial pulses are full and equal bilaterally Extremities:  No clubbing, cyanosis, edema, or deformity noted with normal full range of motion of all joints.   Neurologic:  No cranial nerve deficits noted. Station and gait are normal. Plantar reflexes are down-going bilaterally. DTRs are symmetrical throughout. Sensory, motor and coordinative functions appear intact. Skin:  turgor normal, color normal, no rashes, no suspicious  lesions, no ecchymoses, no petechiae, no purpura, no ulcerations, and no edema.   Cervical Nodes:  no anterior cervical adenopathy and no posterior cervical adenopathy.   Axillary Nodes:  no R axillary adenopathy and no L axillary adenopathy.   Inguinal Nodes:  no R inguinal adenopathy and no L inguinal adenopathy.   Psych:  memory intact for recent and remote, normally interactive, good eye contact, not depressed appearing, not agitated, not suicidal, not homicidal, and moderately anxious.   Additional Exam:  EKG shows SR with frequents PVC's and PRWP and flat T's in V1 and V2.   Impression & Recommendations:  Problem # 1:  PREMATURE VENTRICULAR CONTRACTIONS, FREQUENT (ICD-427.69) Assessment New will look for secondary causes and follow closely, what I see today appears benign Orders: Venipuncture (04540) TLB-BMP (Basic Metabolic Panel-BMET) (80048-METABOL) TLB-CBC Platelet - w/Differential (85025-CBCD) TLB-Hepatic/Liver Function Pnl (80076-HEPATIC) TLB-TSH (Thyroid Stimulating Hormone) (84443-TSH) TLB-GGT (Gamma GT) (82977-GGT) TLB-CK-MB (Creatine Kinase MB) (82553-CKMB) EKG w/ Interpretation (93000) Cardiology Referral (Cardiology)  Problem # 2:  ABUSE, ALCOHOL, IN REMISSION (ICD-305.03) Assessment: Deteriorated she has relapsed Orders: Venipuncture (98119) TLB-BMP (Basic Metabolic Panel-BMET) (80048-METABOL) TLB-CBC Platelet - w/Differential (85025-CBCD) TLB-Hepatic/Liver Function Pnl (80076-HEPATIC) TLB-TSH (Thyroid Stimulating Hormone) (84443-TSH) TLB-GGT (Gamma GT) (82977-GGT) TLB-CK-MB (Creatine Kinase MB) (82553-CKMB)  Problem # 3:  HYPERTENSION (ICD-401.9) Assessment: Improved  Orders: Venipuncture (14782) TLB-BMP (Basic Metabolic Panel-BMET) (80048-METABOL) TLB-CBC Platelet - w/Differential (85025-CBCD) TLB-Hepatic/Liver Function Pnl (80076-HEPATIC) TLB-TSH (Thyroid Stimulating Hormone) (84443-TSH) TLB-GGT (Gamma GT) (82977-GGT) TLB-CK-MB (Creatine Kinase MB)  (82553-CKMB)  BP today: 138/84 Prior BP: 128/70 (11/28/2007)  Labs Reviewed: K+: 3.9 (09/15/2008) Creat: : 0.8 (09/15/2008)   Chol: 224 (09/15/2008)   HDL: 102.00 (09/15/2008)   TG: 98.0 (09/15/2008)  Problem # 4:  URI (ICD-465.9) Assessment: New  this sounds viral  Instructed on symptomatic treatment. Call if symptoms persist or worsen.   Complete Medication List: 1)  Gabapentin 100 Mg Caps (Gabapentin) .... Take 2 tablets by mouth three times a day (please deliver) 2)  Sertraline Hcl 100 Mg Tabs (Sertraline hcl) .Marland Kitchen.. 1 by mouth once daily 3)  Ativan 0.5 Mg Tabs (Lorazepam) .Marland Kitchen.. 1 q  6 as needed anxiety & tremors stop valium 4)  Cipro 250 Mg Tabs (Ciprofloxacin hcl) .... Take 1 tablet by mouth two times a day x 7 days...then go to [place] lab for repeat urinalysis!  Patient Instructions: 1)  Please schedule a follow-up appointment in 2 weeks. 2)  It is not healthy  for men to drink more than 2-3 drinks per day or for women to drink more than 1-2 drinks per day. 3)  Get plenty of rest, drink lots of clear liquids, and use Tylenol or Ibuprofen for fever and comfort. Return in 7-10 days if you're not better:sooner if you're feeling worse.

## 2010-04-28 NOTE — Discharge Summary (Signed)
Summary: Nausea, Vomiting   NAME:  Paige Black, Paige Black              ACCOUNT NO.:  [id number]   MEDICAL RECORD NO.:  [id number]          PATIENT TYPE:  INP   LOCATION:  6737                         FACILITY:  [facility]   PHYSICIAN:  Hedwig Morton. Juanda Chance, MD     DATE OF BIRTH:  [birth year]   DATE OF ADMISSION:  04/11/2006  DATE OF DISCHARGE:  04/11/2006                               DISCHARGE SUMMARY   ADMITTING DIAGNOSES:  1. This is a 65 year old with intractable nausea and vomiting with      secondary dehydration.  2. Hypertension and tachycardia in this setting raising the question      of a withdrawal syndrome.  3. Question of occult ETOH abuse.  4. Abnormal liver function studies.  5. History of hypertension.  6. History of recurrent urinary tract infections and pyelonephritis.  7. Status post remote appendectomy.   DISCHARGE DIAGNOSES:  1. Nausea, vomiting, and dehydration, resolved.  Felt secondary to      ETOH withdrawal.  2. Alcoholism, stable, status post ETOH detox protocol.  3. Electrolyte abnormalities with hypokalemia, hypomagnesemia, and      hypophosphatemia, corrected.  4. Alcoholic hepatitis, improving.   BRIEF HISTORY:  Paige Black is a pleasant 65 year old white female known to Dr.  Juanda Chance and Dr. Debby Bud with history as described above.  At this time she  is admitted with intractable nausea and vomiting over the past several  days with inability to keep down p.o.  She denied any abdominal pain or  fever.  Had denied any diarrhea, melena, or hematochezia but had become  progressively weak and her daughter had called stating she was unable to  walk on the morning of admission, that she had found her at home on the  floor shaking.  The daughter did have concerns about occult alcohol use  which the patient is currently denying.  She had lost about 19 pounds  since 10/2005.  She had previously noted elevated LFTs with mild  transaminitis, hepatitis B and C serologies were done and were  negative.  She has had prior abdominal ultrasound showing some fatty changes in the  liver.  The patient was seen and evaluated in the office by Dr. Juanda Chance  on the day of admission, felt to be dehydrated, extremely weak, and  unable to ambulate.  She was tachycardic with a pulse of 124 and with a  blood pressure of 148/108.  She was transported to the hospital for  hydration and further diagnostic evaluation and will be placed on an  ETOH withdrawal protocol in the interim.   LABORATORY STUDIES ON ADMISSION:  1. WBC 4.0.  Hemoglobin 12.1.  Hematocrit 34.6.  MCV 104.9.  Platelets      121.  2. Electrolytes on admission within normal limits.  BUN less than 1.      Creatinine 0.5.  3. Total bilirubin 1.4.  Alk phos 66.  SGOT 348.  SGPT 333.  Albumin      3.3.  4. Phosphorus less than 1 on admission, this was corrected to 2.3.  5. Magnesium was actually 1.8.  6. Venous  ammonia was 63, corrected to 40.  7. Hepatitis B, C, and A serologies all negative.   X-RAY STUDIES:  1. Abdominal ultrasound shows a fatty liver, unchanged left renal      atrophy.  Otherwise negative.  2. Chest x-ray showed no acute findings.   HOSPITAL COURSE:  The patient was admitted to the service of Dr. [name].  She was hydrated and placed on ETOH withdrawal protocol with  thiamine and Ativan.  She was hypokalemic with a critical potassium of  2.5.  This is corrected over the next several days.  Phosphorus was also  corrected.  The patient initially went through some withdrawal but no  seizures.  She was somewhat confused initially and in denial regarding  her alcoholism; although, she admitted to drinking several glasses of  wine per night.  Did not feel that she needed any alcohol  rehabilitation.  Her family pursued admission to [facility] and we  helped facilitate this and by 1:20 she was more alert and appropriate  and was agreeable to admission to [facility].   PLAN:  Plan at this time is  for discharge with direct admission to  [facility] for a 28-day program.  She has to get psych evaluation  with Dr. Jeanie Sewer prior to discharge.  At this time her Lexapro has  been on hold.  Her diet has advanced to a regular diet.  She was  complaining of some dysuria and was placed on Cipro 250 mg b.i.d.   OTHER MEDICATIONS AT THE TIME OF DISCHARGE:  1. Lexapro, on hold.  2. Protonix 40 mg daily.  3. Multivitamin daily.  4. Chronulac 30 mL daily.  5. Thiamine 1000 mg daily.  6. Phenergan 12.5-25 mg q.6 hours p.r.n.  7. Ativan had been weaned to a p.r.n. dose of 1 mg q.4 hours.   CONDITION ON DISCHARGE:  Stable and improved.      Amy Norco, PA-C      [name] M. Juanda Chance, MD  Electronically Signed    AE/MEDQ  D:  04/16/2006  T:  04/16/2006  Job:  [id number]

## 2010-04-28 NOTE — Assessment & Plan Note (Signed)
Summary: FU PER TRIAGE/NWS   Vital Signs:  Patient profile:   65 year old female Height:      67 inches (170.18 cm) Weight:      138.75 pounds (63.07 kg) BMI:     21.81 O2 Sat:      97 % on Room air Temp:     98.6 degrees F (37.00 degrees C) oral Pulse rate:   74 / minute Pulse rhythm:   regular BP sitting:   128 / 76  (left arm) Cuff size:   regular  Vitals Entered By: Brenton Grills (July 07, 2009 11:33 AM)  O2 Flow:  Room air CC: pt here for follow up visit/med check/aj   Primary Care Provider:  Ardath Lepak  CC:  pt here for follow up visit/med check/aj.  History of Present Illness: Paige Black was recently seen by Dr. Yetta Barre for sore throat. She had an extensive work-up. Reviewed all her labs: she had mildly elevated transaminase levels and an elevated B12. She reports that she has maintained her sobriety. She had a mild EKG abnormality and was referred to Dr. Charlies Constable. She is scheduled for a 2D echo and stress echo. She does report that she is feeling well at this time.   Current Medications (verified): 1)  Gabapentin 100 Mg  Caps (Gabapentin) .... Take 2 Tablets By Mouth Three Times A Day (Please Deliver) 2)  Sertraline Hcl 100 Mg  Tabs (Sertraline Hcl) .Marland Kitchen.. 1 By Mouth Once Daily 3)  Ativan 0.5 Mg  Tabs (Lorazepam) .Marland Kitchen.. 1 Q 6 As Needed Anxiety & Tremors Stop Valium  Allergies (verified): 1)  ! Pcn 2)  ! Sulfa  Past History:  Past Medical History: Last updated: 11/28/2007 Anxiety Depression Hypertension Alcohol abuse, h/o - 28 days at [facility] '08 Hepatitis - alcohol related. pyelonephritis, h/o recurrent cystitis, h/o spinal stenosis-lumbar; congenital anomalous L6 vertebra Right radiculopathy S1-5 with weakness and paresthesia right foot Uterine fibroids    Physician Roster:                  Gyn - Gottsegen                  NS -   Elsner                  GI -    D. 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 Her oldest has been diagnosed with breast cancer (DCIS) but is doing well with treatment work: travel agent Lives alone, $$ stressed history of alcohol excess  Review of Systems  The patient denies anorexia, fever, weight loss, weight gain, chest pain, syncope, dyspnea on exertion, headaches, abdominal pain, incontinence, muscle weakness, difficulty walking, depression, and enlarged lymph nodes.    Physical Exam  General:  alert, well-developed, well-nourished, and well-hydrated.  Looks healthier than when I saw her last Head:  normocephalic and atraumatic.   Eyes:  pupils equal, pupils round, corneas and lenses clear, and no injection.   Ears:  R ear normal and L ear normal.   Mouth:  absent tonsils. Posterior pharynx clear Neck:  full ROM and no thyromegaly.   Lungs:  normal respiratory  effort, normal breath sounds, no crackles, and no wheezes.   Heart:  normal rate, regular rhythm, no gallop, and no rub.   Msk:  normal ROM, no joint tenderness, no joint swelling, no joint warmth, no redness over joints, and no joint deformities.   Pulses:  2+ radial Neurologic:  alert & oriented X3, cranial nerves II-XII intact, and gait normal.   Skin:  turgor normal and color normal.   Cervical Nodes:  no anterior cervical adenopathy and no posterior cervical adenopathy.   Psych:  Oriented X3, memory intact for recent and remote, normally interactive, good eye contact, and not anxious appearing.     Impression & Recommendations:  Problem # 1:  HYPERTENSION, BENIGN (ICD-401.1)  BP today: 128/76 Prior BP: 157/100 (06/22/2009)  Labs  Reviewed: K+: 4.3 (06/09/2009) Creat: : 0.9 (06/09/2009)   Chol: 224 (09/15/2008)   HDL: 102.00 (09/15/2008)   TG: 98.0 (09/15/2008)  Blood pressure is well controlled.   Problem # 2:  ABUSE, ALCOHOL, IN REMISSION (ICD-305.03) Reports being abstemious. Concerned about elevated transaminase levels - may be viral. Also worried about elevated MCV which may be B12 deficiency.  Plan - follow-up LFTs and B12 level in 4 weeks.   Problem # 3:  ANXIETY DEPRESSION (ICD-300.4) By her report she is doing OK.   Problem # 4:  BACK PAIN, LUMBAR, WITH RADICULOPATHY (ICD-724.4) She has persistent loss of sensation in the distal LE. She takes gabapentin intermittently. She has a very normal gait and does not use a cane any longer.  Complete Medication List: 1)  Gabapentin 100 Mg Caps (Gabapentin) .Marland Kitchen.. 1 by mouth two times a day 2)  Sertraline Hcl 100 Mg Tabs (Sertraline hcl) .Marland Kitchen.. 1 by mouth once daily 3)  Ativan 0.5 Mg Tabs (Lorazepam) .Marland Kitchen.. 1 q 6 as needed anxiety & tremors stop valium   Preventive Care Screening  Mammogram:    Date:  02/24/2009    Results:  normal   Pap Smear:    Date:  09/24/2008    Results:  normal

## 2010-04-28 NOTE — Procedures (Signed)
Summary: COLONOSCOPY  COLONOSCOPY   Imported By: Lamona Curl CMA (AAMA) 01/21/2010 17:01:57  _____________________________________________________________________  External Attachment:    Type:   Image     Comment:   External Document

## 2010-04-28 NOTE — Progress Notes (Signed)
  Phone Note Call from Patient   Summary of Call: Pt called requesting lab results. I informed her of letter mailed and stressed she f/u with Dr Debby Bud soon. She wants to wait until she sees cardiology. [facility] made pt apt for april but she requested apt with Dr Juanda Chance b/c he is a personal friend. She will schedule at apt with Dr Debby Bud soon if cardiology apt is many wks away.  Initial call taken by: Lamar Sprinkles, CMA,  June 10, 2009 9:41 AM  Follow-up for Phone Call        CK-MB 4.3 - normal is up to 4.0: this is an insignificant rise. Liver functions are about 10 points above normal - not very much. If drinking any wine - recommend none.   Needs OV Follow-up by: Jacques Navy MD,  June 10, 2009 12:54 PM  Additional Follow-up for Phone Call Additional follow up Details #1::        Pt informed, scheduled for office visit  Additional Follow-up by: Lamar Sprinkles, CMA,  June 11, 2009 10:08 AM

## 2010-04-28 NOTE — Letter (Signed)
Summary: Results Follow-up Letter  [facility]  [address] [place], [place] [address]   Phone: [phone]  Fax: [phone]    06/09/2009  [address] [place], [place]  [address]  Dear Ms. Laurie,   The following are the results of your recent test(s):  Test     Result     Heart enzyme   slightly elevated Liver enzymes   slightly elevated CBC       normal Thyroid     normal Kidney     normal  _________________________________________________________  Please call for an appointment very soon _________________________________________________________ _________________________________________________________ _________________________________________________________  Sincerely,  Sanda Linger MD [facility]

## 2010-04-28 NOTE — Progress Notes (Signed)
Summary: Cipro denied?   Phone Note Call from Patient Call back at Home Phone [phone]   Caller: Patient Call For: Dr. Juanda Chance Reason for Call: Talk to Nurse Summary of Call: pt would like to know why her Cipro rx was denied... denied per pharmacy Initial call taken by: Vallarie Mare,  June 08, 2009 2:37 PM  Follow-up for Phone Call        I have advised patient that Dr Juanda Chance gave her cipro two times a day x 7 days when she had a urinary tract infection last June. Her last u/a was clear. She states that her friend was sick and she later began to feel the same way, however she begin having urinary symptoms as well. I have advise her that this is something that her primary care dr would normally take care of and have asked her to call their office for further advise. Patient verbalizes understanding. Follow-up by: Hortense Ramal CMA Duncan Dull),  June 08, 2009 2:56 PM

## 2010-04-28 NOTE — Progress Notes (Signed)
  Phone Note Call from Patient Call back at Home Phone [phone]   Summary of Call: Patient is requesting a request for u/a. C/o dark urine, low back fatigue, 'shakes', sore throat. Scheduled for office visit for eval tomorrow Initial call taken by: Lamar Sprinkles, CMA,  June 08, 2009 3:30 PM

## 2010-04-28 NOTE — Procedures (Signed)
Summary: EGD  EGD   Imported By: Lamona Curl CMA (AAMA) 01/21/2010 17:02:54  _____________________________________________________________________  External Attachment:    Type:   Image     Comment:   External Document

## 2010-04-28 NOTE — Progress Notes (Signed)
Summary: 1 [facility]  ---- Converted from flag ---- ---- 07/09/2009 10:04 AM, Jacques Navy MD wrote: Cleone Slim again, more labs:  In one month B12 level 790.99, hepatic panel 790.6  Thanks ------------------------------ Labs entered

## 2010-04-28 NOTE — Assessment & Plan Note (Signed)
Summary: Gastroenterology  Paige Black MR#:   Page #2  NAME:  Paige Black, Paige Black  OFFICE NO:  [id number]  DATE:  06/06/04  DOB:  [birth year]  The patient is a very good friend of mine.  She is Dr. Izora Black patient.  The patient is a 65 year old white female, essentially healthy, who has been through a tough long divorce and property settlement and has developed abdominal discomfort, bloating, diarrhea, and dyspepsia.  Symptoms got worse after her return from the [place].  She works as a Firefighter.  She was having diarrhea and abdominal pain, which has subsided, but she had a recurrence of diarrhea the last 2 days.  Dr. Debby Black saw her, did an ultrasound of the upper abdomen, which was negative except for some fatty liver and put her on AcipHex, which greatly helped toward to the acid reflux.  Other problems include history of pyelonephritis, high blood pressure managed by Dr. August Black and recently right hip pain, which is most likely a soft tissue injury.  Last colonoscopy 1999 was normal.  Next one is planned for 2009.  Upper endoscopy in 1998 showed Candida esophagitis.  She was treated appropriately.  PHYSICAL EXAMINATION:  Blood pressure 138/88, pulse 76, weight 148 pounds.  She was somewhat anxious and teary-eyed today.  Lungs were clear to auscultation.  COR:  With normal S1, normal S2.  Abdomen:  Soft, mildly tender in right lower quadrant, postcholecystectomy scar on right lower quadrant.  Liver edge at costal margin.  Generally, soft, relaxed abdomen with good muscular support, no distention.  Rectal exam:  With normal rectal tone.  External hemorrhoidal tags.  Stool was Hemoccult negative.  Extremities:  No edema.  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  Circumstances associated with her divorce.  PLAN: 1.   NuLev 0.125 mg sublingually q. 4-6h. p.r.n. abdominal discomfort. 2.   Continue AcipHex 20 mg p.o. q.d. 3.   Xanax 0.25 mg p.o. q. p.m., may also take in the mornings.   I will see her in the next few weeks.    Paige Morton. Juanda Black, M.D.  DMB/jf/bg cc:  Paige Gess. Black, M.P.H., M.D. D:  06/06/04; T:  06/06/04; Job [phone]

## 2010-04-28 NOTE — Progress Notes (Signed)
  Phone Note Refill Request Message from:  Pharmacy on May 10, 2009 1:20 PM  Refills Requested: Medication #1:  GABAPENTIN 100 MG  CAPS Take 2 tablets by mouth three times a day (please deliver)  Medication #2:  SERTRALINE HCL 100 MG  TABS 1 by mouth once daily Pharm. needs prescriptions faxed.   Method Requested: Fax to Local Pharmacy Initial call taken by: Ami Bullins CMA,  May 10, 2009 1:20 PM    Prescriptions: SERTRALINE HCL 100 MG  TABS (SERTRALINE HCL) 1 by mouth once daily  #30 Tablet x 3   Entered by:   Ami Bullins CMA   Authorized by:   Jacques Navy MD   Signed by:   Bill Salinas CMA on 05/10/2009   Method used:   Faxed to ...       [facility] (retail)       [address]       [place], [place]  [id number]       Ph: [phone] or [phone]       Fax: [phone]   RxID:   [id number] GABAPENTIN 100 MG  CAPS (GABAPENTIN) Take 2 tablets by mouth three times a day (please deliver)  #180 x 2   Entered by:   Bill Salinas CMA   Authorized by:   Jacques Navy MD   Signed by:   Bill Salinas CMA on 05/10/2009   Method used:   Faxed to ...       [facility] (retail)       [address]       [place], [place]  [address]       Ph: [phone] or [phone]       Fax: [phone]   RxID:   [id number]

## 2010-04-28 NOTE — Letter (Signed)
   [facility] [address] [place], [place]  [address] Phone: [phone]      Aug 16, 2009   [facility] [address] [place], [place] [address]  RE:  LAB RESULTS  Dear  Ms. Bisesi,  The following is an interpretation of your most recent lab tests.  Please take note of any instructions provided or changes to medications that have resulted from your lab work.  LIVER FUNCTION TESTS:  Good - no changes needed      B12 level is normal   Sincerely Yours,    Jacques Navy MD  Patient: Paige Black Note: All result statuses are Final unless otherwise noted.  Tests: (1) Hepatic/Liver Function Panel (HEPATIC)   Total Bilirubin           0.5 mg/dL                   0.3-4.7   Direct Bilirubin          0.1 mg/dL                   4.2-5.9   Alkaline Phosphatase      57 U/L                      39-117   AST                       24 U/L                      0-37   ALT                       16 U/L                      0-35   Total Protein             6.9 g/dL                    5.6-3.8   Albumin                   4.2 g/dL                    7.5-6.4  Tests: (2) B12 Serum - Total ONLY (B12)   Vitamin B12               361 pg/mL                   211-911

## 2010-04-28 NOTE — Assessment & Plan Note (Signed)
Summary: bp check--stc   Nurse Visit   Vital Signs:  Patient profile:   65 year old female BP sitting:   140 / 90  (left arm) Cuff size:   regular  Vitals Entered By: Lamar Sprinkles, CMA (Aug 09, 2009 9:35 AM) CC: ELEVATED BP   Allergies: 1)  ! Pcn 2)  ! Sulfa  Orders Added: 1)  Est. Patient Level I [[address]]

## 2010-04-28 NOTE — Assessment & Plan Note (Signed)
Summary: np6/ abnormal ekg    Visit Type:  new pt visit Primary Provider:  Norins  CC:  pt here for abnormal EKG......denies any cp or sob or edema.  History of Present Illness: Paige Black is 65 years old and is referred for evaluation of PVCs and an abnormal ECG. 2 as a personal friend who I've known for many years.  She has no prior history of known heart disease. She was recently found to have extra beats on examination and Dr. Barnett Applebaum office and her last cardiogram showed frequent PVCs and a possible old anterior MI. She has never been told of having an abnormal ECG or skipped heartbeat in the past. She has had no symptoms of chest pain shortness of breath or palpitations. She is not able to feel her PVCs.  She has been under increased stress recently due to family stress. She only consumes minimal caffeine in tea.  She has a history of a previous alcohol problem.  her risk factors for vascular disease include a positive family history of diabetes in her mother and 2 siblings and a positive family history for coronary disease in her father who had bypass surgery in his 47s. She has had recent elevation of her blood pressure but if this is never been treated in the past. She had been a smoker but she quit several years ago. Her lipids have been good with a high HDL.    Current Medications (verified): 1)  Gabapentin 100 Mg  Caps (Gabapentin) .... Take 2 Tablets By Mouth Three Times A Day (Please Deliver) 2)  Sertraline Hcl 100 Mg  Tabs (Sertraline Hcl) .Marland Kitchen.. 1 By Mouth Once Daily 3)  Ativan 0.5 Mg  Tabs (Lorazepam) .Marland Kitchen.. 1 Q 6 As Needed Anxiety & Tremors Stop Valium  Allergies: 1)  ! Pcn 2)  ! Sulfa  Past History:  Past Medical History: Reviewed history from 11/28/2007 and no changes required. Anxiety Depression Hypertension Alcohol abuse, h/o - 28 days at [facility] '08 Hepatitis - alcohol related. pyelonephritis, h/o recurrent cystitis, h/o spinal stenosis-lumbar; congenital  anomalous L6 vertebra Right radiculopathy S1-5 with weakness and paresthesia right foot Uterine fibroids    Physician Roster:                  Gyn - Gottsegen                  NS -   Elsner                  GI -    D. Mackensi Mahadeo  Family History: Reviewed history from 07/12/2007 and no changes required. father - deceased @ 70: CAD/MI, CHF, AAA, HTN, Dementia mother - '23: DM, HTN PAunt - breast cancer Brother - killed in [place] Neg - colon cancer  Social History: Reviewed history from 07/12/2007 and no changes required. [employer], MA Married - ___yrs divorced 3 daughters work: travel Water quality scientist Lives alone, $$ stressed history of alcohol excess  Review of Systems       ROS is negative except as outlined in HPI.   Vital Signs:  Patient profile:   65 year old female Height:      67 inches Weight:      141 pounds Pulse rate:   72 / minute Pulse rhythm:   irregular BP sitting:   157 / 100  (left arm) Cuff size:   regular  Vitals Entered By: Danielle Rankin, CMA (June 22, 2009 11:02 AM)  Physical Exam  Additional Exam:  Gen. Well-nourished, in no distress Head: Normocephalic    Eyes: PERRLA/EOM intact; conjunctiva and lids normal Neck: No JVD, thyroid not enlarged, no carotid bruits Lungs: No tachypnea, clear w/o rales, rhonchi or wheezes CV: Rhythm regular with frequent extrasystoles, PMI not displaced,  sounds  nl, no murmurs or gallops, no edema, pulses nl UE and LE Abd: BS nl, abd soft and non-tender w/o masses or hepatosplenomegaly MS: No deformities Neuro: no focal sns Skin: no lesions Psych: nl affect    Impression & Recommendations:  Problem # 1:  PREMATURE VENTRICULAR CONTRACTIONS (ICD-427.69) She has frequent PVCs with some trigeminy. She is not symptomatic from this. The main issue is to determine whether this is an indication of any serious underlying organic heart disease. We plan to evaluate her with a echo and rest stress echocardiogram. If this is negative  I don't think she'll require any further evaluation. She is aware that caffeine alcohol and stress could aggravate the PVCs  Problem # 2:  HYPERTENSION, BENIGN (ICD-401.1) Her blood pressure is somewhat elevated today and she says it was elevated when she had her Social Security disability exam. She says at that time it was 180/120. However it was normal when she had her exam by Dr. Yetta Barre recently. We will get a followup for this when she comes in for her echo stress test.  Other Orders: EKG w/ Interpretation (93000) Echocardiogram (Echo) Stress Echo (Stress Echo)  Patient Instructions: 1)  Your physician has requested that you have a stress echocardiogram. For further information please visit [email or web address]  Please follow instruction sheet as given. 2)  Your physician has requested that you have an echocardiogram.  Echocardiography is a painless test that uses sound waves to create images of your heart. It provides your doctor with information about the size and shape of your heart and how well your heart's chambers and valves are working.  This procedure takes approximately one hour. There are no restrictions for this procedure. 3)  We will call you with the results of your tests.

## 2010-04-28 NOTE — Progress Notes (Signed)
Summary: BP f/u   Medications Added COZAAR 100 MG TABS (LOSARTAN POTASSIUM) take one tab by mouth once daily       Phone Note Outgoing Call   Call placed by: Sherri Rad, RN, BSN,  Aug 10, 2009 2:28 PM Call placed to: Patient Summary of Call: Dr. Juanda Chance reviewed the pt's BP reading from this week at Dr. Debby Bud office. Orders received to have the pt increase Cozaar to 100mg  once daily and repeat a bp check in 2 weeks.  The pt is agreeable and verbalizes understanding. She will come on 6/2 for a bp check. Initial call taken by: Sherri Rad, RN, BSN,  Aug 10, 2009 2:29 PM    New/Updated Medications: COZAAR 100 MG TABS (LOSARTAN POTASSIUM) take one tab by mouth once daily Prescriptions: COZAAR 100 MG TABS (LOSARTAN POTASSIUM) take one tab by mouth once daily  #30 x 11   Entered by:   Sherri Rad, RN, BSN   Authorized by:   Lenoria Farrier, MD, [facility]   Signed by:   Sherri Rad, RN, BSN on 08/10/2009   Method used:   Electronically to        [facility] (retail)       [address]       [place], [place]  [id number]       Ph: [phone] or [phone]       Fax: [phone]   RxID:   [id number]

## 2010-04-28 NOTE — Progress Notes (Signed)
Summary: bp f/u    ---- Converted from flag ---- ---- 08/26/2009 8:47 PM, Lenoria Farrier, MD, [facility] wrote: Herbert Seta, We should probably have her see Dr Debby Bud to decide next step since he will be following long term. BB ------------------------------  Phone Note Outgoing Call   Call placed by: Sherri Rad, RN, BSN,  August 27, 2009 12:17 PM Summary of Call: I left a message for the pt to call. Initial call taken by: Sherri Rad, RN, BSN,  August 27, 2009 12:17 PM  Follow-up for Phone Call        I spoke with the pt. She will f/u with Dr. Debby Bud.  Follow-up by: Sherri Rad, RN, BSN,  August 27, 2009 2:21 PM

## 2010-04-28 NOTE — Letter (Signed)
Summary: Need Labs and Office Visit  [facility]  [address] [place], [place] [address]   Phone: [phone]  Fax: [phone]            04/06/2010  [facility] [address] [place], [place]  [address]  Dear Ms. Bourbon,  Our office has tried to contact you to ask that you come for labwork which has been recommended by Dr [name]. Originally, we asked that you have labwork completed before your scheduled office visit. However, that office visit was cancelled and not rescheduled. It is imperative that you have labs completed at regular intervals to insure that your liver function tests remain within reasonable limits. Please contact our office a [phone] should you have questions. Otherwise, please go to the basement floor of Dr Regino Schultze office any time between 8 am and 5 pm, Monday-Friday to have those tests completed. Dr Juanda Chance is awaiting those results and will call you when they arrive.  Also, please make a note that Dr Debby Bud would like to see you in the office as well and requires that you do so for refills of your medications. Please contact his office to make that appointment if you have not already done so.  Thank you for your prompt attention to this matter and we look forward to participating in your healthcare needs.   Sincerely,   Hedwig Morton. Juanda Chance, MD

## 2010-04-28 NOTE — Progress Notes (Signed)
  Phone Note From Other Clinic   Summary of Call: Good morning Paige Black,  Please call the pharmacy and cancel the refills on Ms. Burry's ativan. She can have one month only and MUST come in to see me. Thanks Initial call taken by: Jacques Navy MD,  April 06, 2010 6:27 AM

## 2010-05-04 NOTE — Assessment & Plan Note (Addendum)
Summary: MED CHECK-LB   Vital Signs:  Patient profile:   65 year old female Height:      67 inches Weight:      148 pounds BMI:     23.26 O2 Sat:      93 % on Room air Temp:     97.8 degrees F oral Pulse rate:   70 / minute BP sitting:   138 / 82  (left arm) Cuff size:   regular  Vitals Entered By: Bill Salinas CMA (April 28, 2010 11:23 AM)  O2 Flow:  Room air CC: OV for med check/ ab   Primary Care Provider:  Illene Regulus  CC:  OV for med check/ ab.  History of Present Illness: presents for med check.   She is still drinking but trying to cut down and taper off. Discussed the need to consider rejoining AA. She is having regular LFTs per Dr. Diamond Nickel. Admits that she has a problem.  she reports that her Blood pressure has been doing well on medication.  She is current with mammography and will be going to see D. Gottsegen soon.   She is current for colorectal cancer screening.   Current Medications (verified): 1)  Gabapentin 100 Mg  Caps (Gabapentin) .Marland Kitchen.. 1 By Mouth Two Times A Day 2)  Sertraline Hcl 100 Mg  Tabs (Sertraline Hcl) .Marland Kitchen.. 1 By Mouth Once Daily 3)  Ativan 0.5 Mg  Tabs (Lorazepam) .Marland Kitchen.. 1 Q 6 As Needed Anxiety & Tremors Stop Valium 4)  Cozaar 100 Mg Tabs (Losartan Potassium) .... Take One Tab By Mouth Once Daily 5)  Prilosec 20 Mg Cpdr (Omeprazole) .... Take 1 Tablet By Mouth Once A Day  Allergies (verified): 1)  ! Pcn 2)  ! Sulfa  Past History:  Past Medical History: Last updated: 11/28/2007 Anxiety Depression Hypertension Alcohol abuse, h/o - 28 days at [facility] '08 Hepatitis - alcohol related. pyelonephritis, h/o recurrent cystitis, h/o spinal stenosis-lumbar; congenital anomalous L6 vertebra Right radiculopathy S1-5 with weakness and paresthesia right foot Uterine fibroids    Physician Roster:                  Gyn - Gottsegen                  NS -   Elsner                  GI -    D. 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 Her oldest has been diagnosed with breast cancer (DCIS) but is doing well with treatment work: travel agent Lives alone, $$ stressed history of alcohol excess FH reviewed for relevance  Review of Systems       The patient complains of difficulty walking and depression.  The patient denies anorexia, fever, weight loss, chest pain, dyspnea on exertion, abdominal pain, hematochezia, severe indigestion/heartburn, muscle weakness, and abnormal bleeding.    Physical Exam  General:  alert, well-developed, well-nourished, and well-hydrated.   Head:  normocephalic and atraumatic.   Eyes:  pupils equal and pupils round.   Neck:  supple, full ROM, and no carotid bruits.   Chest Wall:  no deformities.   Lungs:  normal respiratory effort and normal breath sounds.   Heart:  normal rate, regular rhythm, no murmur, and no JVD.   Abdomen:  soft and normal bowel sounds.   Msk:  normal ROM, no joint tenderness, no joint swelling, no joint warmth, and no joint deformities.   Pulses:  1+ radial pulse Neurologic:  Resting tremor.alert & oriented X3, cranial nerves II-XII intact, gait normal, DTRs symmetrical and normal, and finger-to-nose normal.   Skin:  turgor normal, color normal, no ecchymoses, and no purpura.   Psych:  Oriented X3, memory intact for recent and remote, normally interactive, and good eye contact.     Impression & Recommendations:  Problem # 1:  ALCOHOL ABUSE (ICD-305.00) A very difficult problem and she has relapsed. Her liver functions show mild elevation. Discussed the chronicity of this disease and the  life-threatening nature of the disease. She had been in [place] but has stopped attending meetings.  Plan - Strongly encouraged her to rejoin AA - took no excuse, i.e. transportation issues, etc.           As part of the above - stop all alcohol, including wine.           Return visit in no more than 3 months  Problem # 2:  HYPERTENSION, BENIGN (ICD-401.1)  Her updated medication list for this problem includes:    Cozaar 100 Mg Tabs (Losartan potassium) .Marland Kitchen... Take one tab by mouth once daily  BP today: 138/82 Prior BP: 142/98 (01/27/2010)  Labs Reviewed: K+: 4.3 (06/09/2009) Creat: : 0.9 (06/09/2009)    Adequate control. Plan is to continue cozaar and close monitoring. BP will improve if she stops drinking  Problem # 3:  ANXIETY DEPRESSION (ICD-300.4) Seemingly stable on sertraline and lorazepam.  Problem # 4:  BACK PAIN, LUMBAR, WITH RADICULOPATHY (ICD-724.4) No change in condition.  Complete Medication List: 1)  Gabapentin 100 Mg Caps (Gabapentin) .Marland Kitchen.. 1 by mouth two times a day 2)  Sertraline Hcl 100 Mg Tabs (Sertraline hcl) .Marland Kitchen.. 1 by mouth once daily 3)  Ativan 0.5 Mg Tabs (Lorazepam) .Marland Kitchen.. 1 q 6 as needed anxiety & tremors stop valium 4)  Cozaar 100 Mg Tabs (Losartan potassium) .... Take one tab by mouth once daily 5)  Prilosec 20 Mg Cpdr (Omeprazole) .... Take 1 tablet by mouth once a day Prescriptions: PRILOSEC 20 MG CPDR (OMEPRAZOLE) Take 1 tablet by mouth once a day  #30 x 6   Entered and Authorized by:   Jacques Navy MD   Signed by:   Jacques Navy MD on 04/28/2010   Method used:   Electronically to        [facility] (retail)       [address]       [place], [place]  [id number]       Ph: [phone] or [phone]       Fax: [phone]   RxID:   [id number] ATIVAN 0.5 MG  TABS (LORAZEPAM) 1 q 6 as needed anxiety & tremors stop valium  #100 x 1   Entered and Authorized by:   Jacques Navy MD   Signed by:   Jacques Navy MD on  04/28/2010   Method used:   Handwritten   RxID:   [id number] SERTRALINE HCL 100 MG  TABS (SERTRALINE HCL) 1 by mouth once daily  #30 Tablet x 5   Entered and Authorized by:   Jacques Navy MD  Signed by:   Jacques Navy MD on 04/28/2010   Method used:   Electronically to        [facility] (retail)       [address]       [place], [place]  [address]       Ph: [phone] or [phone]       Fax: [phone]   RxID:   [id number]    Orders Added: 1)  Est. Patient Level III [[address]]

## 2010-05-05 ENCOUNTER — Other Ambulatory Visit: Payer: Self-pay | Admitting: Internal Medicine

## 2010-05-05 ENCOUNTER — Telehealth: Payer: Self-pay | Admitting: Internal Medicine

## 2010-05-05 LAB — HEPATIC FUNCTION PANEL: Albumin: 4.7 g/dL (ref 3.5–5.2)

## 2010-05-12 NOTE — Progress Notes (Signed)
Summary: Lab results   Phone Note Call from Patient Call back at Home Phone [phone]   Caller: Patient Call For: Dr. Juanda Chance Reason for Call: Lab or Test Results Summary of Call: Calling about Lab results Initial call taken by: Karna Christmas,  May 05, 2010 11:36 AM  Follow-up for Phone Call        Patient advised that results have just been entered into emr (there was a glitch in the computer system) and that Dr Juanda Chance will review results and we will get back with her regarding those results as soon as Dr Juanda Chance has looked over them. Dottie Nelson-Smith CMA (AAMA)  May 05, 2010 1:36 PM

## 2010-05-26 ENCOUNTER — Other Ambulatory Visit: Payer: Self-pay | Admitting: Obstetrics and Gynecology

## 2010-05-26 ENCOUNTER — Encounter (INDEPENDENT_AMBULATORY_CARE_PROVIDER_SITE_OTHER): Payer: Medicare Other | Admitting: Obstetrics and Gynecology

## 2010-05-26 ENCOUNTER — Other Ambulatory Visit (HOSPITAL_COMMUNITY)
Admission: RE | Admit: 2010-05-26 | Discharge: 2010-05-26 | Disposition: A | Discharge status: Home / Self Care | Payer: Medicare Other | Source: Ambulatory Visit | Attending: Obstetrics and Gynecology | Admitting: Obstetrics and Gynecology

## 2010-05-26 DIAGNOSIS — Z9189 Other specified personal risk factors, not elsewhere classified: Secondary | ICD-10-CM

## 2010-05-26 DIAGNOSIS — N952 Postmenopausal atrophic vaginitis: Secondary | ICD-10-CM

## 2010-05-26 DIAGNOSIS — R350 Frequency of micturition: Secondary | ICD-10-CM

## 2010-05-26 DIAGNOSIS — N951 Menopausal and female climacteric states: Secondary | ICD-10-CM

## 2010-06-03 ENCOUNTER — Encounter: Payer: Self-pay | Admitting: Internal Medicine

## 2010-06-23 NOTE — Letter (Signed)
Summary: [facility]  [facility]   Imported By: Sherian Rein 06/13/2010 09:04:43  _____________________________________________________________________  External Attachment:    Type:   Image     Comment:   External Document

## 2010-07-25 ENCOUNTER — Telehealth: Payer: Self-pay | Admitting: *Deleted

## 2010-07-25 NOTE — Telephone Encounter (Signed)
Message copied by Jesse Fall on Mon Jul 25, 2010 11:13 AM ------      Message from: Jesse Fall      Created: Wed Jun 01, 2010 11:22 AM       Call remind patient is due for LFT lab on 07/26/10

## 2010-07-25 NOTE — Telephone Encounter (Signed)
Called and reminded patient to have labs drawn tomorrow.

## 2010-07-26 ENCOUNTER — Other Ambulatory Visit: Payer: Self-pay | Admitting: Internal Medicine

## 2010-07-26 ENCOUNTER — Other Ambulatory Visit: Payer: Medicare Other

## 2010-07-26 DIAGNOSIS — Z8719 Personal history of other diseases of the digestive system: Secondary | ICD-10-CM

## 2010-08-02 NOTE — Telephone Encounter (Signed)
Called and left message for patient to call me re: labs she needs to have drawn.

## 2010-08-03 ENCOUNTER — Other Ambulatory Visit (INDEPENDENT_AMBULATORY_CARE_PROVIDER_SITE_OTHER): Payer: Medicare Other

## 2010-08-03 DIAGNOSIS — Z8719 Personal history of other diseases of the digestive system: Secondary | ICD-10-CM

## 2010-08-03 LAB — HEPATIC FUNCTION PANEL
AST: 30 U/L (ref 0–37)
Alkaline Phosphatase: 45 U/L (ref 39–117)
Bilirubin, Direct: 0.1 mg/dL (ref 0.0–0.3)
Total Bilirubin: 0.7 mg/dL (ref 0.3–1.2)

## 2010-08-09 NOTE — Discharge Summary (Signed)
[name], [name]                 ACCOUNT NO.:  [id number]   MEDICAL RECORD NO.:  [id number]          PATIENT TYPE:  INP   LOCATION:  3018                         FACILITY:  [facility]   PHYSICIAN:  Stefani Dama, M.D.  DATE OF BIRTH:  [birth year]   DATE OF ADMISSION:  05/27/2007  DATE OF DISCHARGE:  05/31/2007                               DISCHARGE SUMMARY   ADMITTING DIAGNOSIS:  Chronically herniated nucleus pulposus L5-S1 with  right L5 radiculopathy.   DISCHARGE AND FINAL DIAGNOSIS:  Chronically herniated nucleus pulposus  L6-S1 with right lumbar radiculopathy of L5 nerve root distribution,  lumbar decompression.   MAJOR OPERATION:  Lumbar decompression anteriorly L5-6 and L6-S1 with  anterior lumbar interbody arthrodesis and titanium plate fixation on  May 27, 2007.   CONDITION ON DISCHARGE:  Improving.   HOSPITAL COURSE:  Paige Black is a 65 year old individual who has had  significant back and right lower extremity pain and weakness.  She has  evidence of a right L5 radiculopathy, with evidence of calf cramping and  weakness in the tibialis anterior group.  She was found to have extruded  fragment of disk in the extraforaminal space on the right side, causing  significant pain and dysfunction.  The pain had subsided largely;  however, the patient was having increasing motor weakness.  She was  advised regarding surgical intervention which was performed on May 27, 2007.  At the time of surgery, it was noted the patient has six lumbar  vertebrae.  The typical L5-S1 vertebra was actually an L5-L6 and L6-S1,  was on a level of the laterally placed herniation.  Both these areas  were decompressed and stabilized via an anterior procedure,  postoperatively.  She has had a smooth course.  She has been ambulatory.  The patient has had some obstipation, and this seems to be getting  better with passage of time.  Clinically, she was given a prescription  for Percocet #60 without  refills, Valium 5 mg without refills, and this  should suffice until the patient's follow-up visit in 2 weeks.  She is  feeling significantly improved and is discharged at this time.  She will  also be using some Lovenox 40 mg sub-cu in the morning for 6 days.      Stefani Dama, M.D.  Electronically Signed     HJE/MEDQ  D:  05/31/2007  T:  06/01/2007  Job:  [id number]

## 2010-08-09 NOTE — Op Note (Signed)
NAMESUNJAI, LEVANDOSKI                 ACCOUNT NO.:  [id number]   MEDICAL RECORD NO.:  [id number]          PATIENT TYPE:  INP   LOCATION:  3301                         FACILITY:  [facility]   PHYSICIAN:  Balinda Quails, M.D.    DATE OF BIRTH:  01-29-1946   DATE OF PROCEDURE:  05/27/2007  DATE OF DISCHARGE:                               OPERATIVE REPORT   SURGEON:  Balinda Quails, MD,   CO-SURGEON:  Stefani Dama M.D.   ANESTHETIC:  General endotracheal.   ANESTHESIOLOGIST:  Kaylyn Layer. Michelle Piper, M.D.   PREOPERATIVE DIAGNOSIS:  L5-S1 degenerative disc disease.   POSTOPERATIVE DIAGNOSIS:  L5-S1 degenerative disc disease.   PROCEDURE:  L5-S1 anterior lumbar interbody fusion bracket (ALIF).   CLINICAL NOTE:  Paige Black is a 65 year old female with chronic back  pain and L5-S1 degenerative disc disease.  Scheduled today to undergo L5-  S1 ALIF.  The patient was seen preoperatively in the holding area.  Discontinued tobacco approximately a week ago.  No history of peripheral  vascular disease, DVT, or pulmonary embolus.  Normal lower extremity  pulses.  Only previous operative procedure was an ectopic pregnancy.   The patient was felt to be a satisfactory candidate for L5-S1 ALIF.  Details of the operative procedure were explained to the patient  preoperatively.  Potential risks were discussed, including but not  limited to DVT, pulmonary embolus, bleeding, transfusion, limb ischemia,  other vessel injury, ureter injury, hernia, deep infection, or other  complication.  The patient consented for surgery.   OPERATIVE PROCEDURE:  The patient was brought to the operating room in  stable condition.  Placed in a supine position.  General endotracheal  anesthesia induced.  Pulse oximetry placed on the left foot.  Remained  94% throughout the operative procedure.   The abdomen was prepped and draped in a sterile fashion in the supine  position.  Transverse skin incision made in the left lower  quadrant at  the projection of L5-S1.  Subcutaneous tissue divided with  electrocautery.  Left anterior rectus sheath cleared and divided  transversely from midline to the lateral margin of the rectus.  Rectus  muscle mobilized bluntly.  Retracted medially.  The left retroperitoneal  space was entered without difficulty.  Peritoneal contents rotated  anteriorly, and the psoas muscle and genitofemoral nerve identified, the  nerve preserved on the muscle.   The left ureter was swept off of the left common iliac artery.  The left  common and external iliac artery was skeletonized bluntly, pushing the  lymphatics laterally.  Medial dissection was then carried down with a  finger onto the sacral promontory, and with a finger the L5-S1 disc  could be palpated.  The left common iliac vein was pushed laterally, and  small sacral branches were controlled with bipolar cautery.   The middle sacral artery was controlled with clips and divided.  The L5-  S1 disc was exposed from left to right.  The right common iliac vein was  pushed off of the disc laterally.   A Thompson-Brau retractor was then assembled  and, using reverse lip Brau  blades, the lateral margins of L5 and S1 were hooked with the blades,  exposing the disc bilaterally.  Malleable retractors were placed  superiorly and inferiorly to fully expose the disc.   With the disc fully exposed.  Dr. Danielle Dess then completed the operative  procedure, including L5-S1 ALIF, along with closure.   There were no apparent complications associated with the exposure  procedure.      Balinda Quails, M.D.  Electronically Signed     PGH/MEDQ  D:  05/27/2007  T:  05/28/2007  Job:  [id number]

## 2010-08-09 NOTE — H&P (Signed)
NAMECARMON, Paige Black                 ACCOUNT NO.:  [id number]   MEDICAL RECORD NO.:  [id number]          PATIENT TYPE:  INP   LOCATION:  3301                         FACILITY:  [facility]   PHYSICIAN:  Stefani Dama, M.D.  DATE OF BIRTH:  08-17-1945   DATE OF ADMISSION:  05/27/2007  DATE OF DISCHARGE:                              HISTORY & PHYSICAL   ADMISSION DIAGNOSIS:  Lumbar spondylolisthesis with herniated nucleus  pulposus and right L5 radiculopathy.   HISTORY OF PRESENT ILLNESS:  Paige Black is a 65 year old who, around  the Christmas holiday, developed a severe onset of a right L5  radiculopathy with severe pain and weakness in her tibialis anterior.  She was treated with some transforaminal epidural steroid injections and  she seemed to get better in regards to the pain.  However, over the last  two months' time she has had progressive increasing weakness with  worsening foot drop and some atrophy in the musculature involving the  tibialis anterior group and also the gluteus on that right side.  She  was advised ultimately of surgical decompression.  The patient is now  being admitted for that procedure.   CURRENT MEDICATIONS:  OxyContin and diazepam, Prilosec, Xanax.   ALLERGIES:  PENICILLIN and SULFA.   PAST MEDICAL HISTORY:  Her general health has been very good.  She  reports no significant medical problems other than some mild I have  blood pressure for which she takes some hydrochlorothiazide.   PERSONAL HISTORY:  She smokes about a half pack cigarettes a day.  She  notes an alcohol.  Her height and weight have been stable 5 feet 6 and  130 pounds.   REVIEW OF SYSTEMS:  Notable for wearing glasses.  Leg pain while  walking.  Leg weakness, back pain, leg pain, arthritis, depression all  noted on a 14-point review sheet.   PHYSICAL EXAMINATION:  GENERAL APPEARANCE:  She is an alert and oriented  individual in no obvious distress.  NEUROLOGIC:  She can stand straight  and erect, but when she walks there  is evidence of foot drop on that right lower extremity along with some  weakness in the gluteus muscle causing a mild circumduction.  Her motor  strength graded at 3/5 in the tibialis anterior and gastroc strength is  a 4/5, deep tendon reflexes are 2+ the patellae, 2+ in the Achilles  bilaterally.  Straight leg raising reproduces back and right lower  extremity pain at 45 degrees.  Patrick's maneuver is negative  bilaterally.  Sensation is intact and light touch in the distal lower  extremities.  Cranial nerve examination is within the limits of normal.  Upper strength and reflexes are normal.  NECK:  No bruits.  No masses are noted.  LUNGS:  Clear to auscultation.  HEART:  Regular rate and rhythm.  ABDOMEN:  Soft.  Bowel sounds positive.  No masses are palpable.  EXTREMITIES:  Reveal no cyanosis, clubbing or edema.   IMPRESSION:  The patient has evidence of a herniated nucleus pulposus,  spondylolisthesis at L5-S1 and is now being admitted to  undergo surgical  decompression and arthrodesis.      Stefani Dama, M.D.  Electronically Signed     HJE/MEDQ  D:  05/27/2007  T:  05/28/2007  Job:  [id number]

## 2010-08-09 NOTE — Op Note (Signed)
Paige Black, Paige Black                 ACCOUNT NO.:  [id number]   MEDICAL RECORD NO.:  [id number]          PATIENT TYPE:  INP   LOCATION:  3301                         FACILITY:  [facility]   PHYSICIAN:  Stefani Dama, M.D.  DATE OF BIRTH:  1946-03-18   DATE OF PROCEDURE:  05/27/2007  DATE OF DISCHARGE:                               OPERATIVE REPORT   PREOPERATIVE DIAGNOSIS:  L5-S1 herniated nucleus pulposus with  spondylolisthesis and lumbar radiculopathy.   POSTOPERATIVE DIAGNOSIS:  L6-S1 spondylolisthesis with lumbar  radiculopathy and herniated nucleus pulposus.   PROCEDURE:  L5-L6 and L6-S1 anterior lumbar decompression, arthrodesis  with autograft and allograft, anterior reconstruction with PEEK spacer  and titanium plate N6-E9 and the L6-S1.   SURGEON:  Stefani Dama, M.D.   APPROACH:  Balinda Quails, M.D.   ANESTHESIA:  General endotracheal.   INDICATIONS:  Paige Black is a 65 year old individual who had developed  a foot drop, with an acute herniated nucleus pulposus back around the  Christmas holiday.  She was treated with some epidural steroids and her  pain seemed to be getting better however, the patient was developing  worsening foot drop with weakness in her right lower extremity.  She was  noted to have an EMG nerve conduction study that demonstrated active  denervation and she is starting to lose more function in that right leg.  After careful consideration of her options, it was advised that she  consider surgical decompression arthrodesis.  The patient has had a  plain x-ray which demonstrated six lumbar type vertebrae.  The MRI,  however, was labeled with five lumbar type vertebra with the lowest  interspace showing a spondylolisthesis and a herniated nucleus pulposus.   PROCEDURE:  The patient was brought to the operating room, placed on  table in supine position.  After the smooth induction of general  endotracheal anesthesia, the front of the sacral  promontory was easily  palpable and the area of the incision was marked on the abdomen.  The  approach was performed by Dr. Liliane Bade to expose the lowest  identifiable disk space at L5-S1.  An initial image was obtained in the  AP projection which indeed demonstrated the  L5-S1 interspace on an AP  fluoroscopic image.  A diskectomy was then performed at this level.  Decompression was performed and the disk itself was noted be remarkably  degenerated as the lateral gutters were identified.  There is noted to  be significant bulging of the disk material into the lateral gutters,  particularly on the right side.  Then the interspace was sized for  appropriate placement of the PEEK spacer with titanium plate fixation.  Lateral flouroscopic images noted  an additional disk space below the L5-  S1 space which featured a spondylolisthesis.  This was then verified as  an L6 type vertebra.  During the initial dissection, the typical  landmarks of the aortic bifurcation, middle sacral vein, sacral  promontory, ileolumbar vein were consistent with L5-S1 anatomy.  The L6-  S1 interspace was noted be substantially deeper into the pelvis beyond  what was initially palpably verified as sacral promontory.  Next the  interspace L5-L6 was instrumented with PEEK spacer.  This PEEK spacer  measured 26 x 32 mm in size, was 12 mm tall and had 12 degrees lordosis.  The spacer was fixed in position with 20 mm screws.  Attention was then  turned to the L6-S1 space and the prevertebral dissection was performed  to mobilize presacral veins.  Once this was completed, the interspace  was exposed and diskectomy was performed here.  Here on the right side,  a free fragment of disk was encountered and this was excised.  The  interspace was then completely decorticated.  It was shaped slightly  with the use of a high-speed bur and a 5 mm oval bit and once the  interspace was completely prepared, it was sized for a PEEK  spacer which  in this case was noted be 13.5 mm in height, 26 x 32 mm in size, again  with a 12 degrees lordosis.  This was placed into the interspace and  localizing radiographs were obtained in the AP and lateral projection.  Here, 25 mm screws were placed into the lowest part of the sacrum and 20  mm screws were placed in the L6 vertebra.  Final confirmatory  radiographs were obtained in the AP and lateral projection and showed  good decompression and stabilization in a neutral position.  The graft  material chosen for the PEEK spacers was a combination of a bone marrow  aspirate from the L5 vertebra, which was obtained with a Jamshidi  needle, mixed with Fortoss, a bone substitute.  This was placed into the  interspace spaces at the L5-L6 in L6-S1 space.  With this, hemostasis  was achieved in the soft tissues and the retractors were removed.  The  wound was checked for hemostasis superficially and then the wound was  closed with closure of the anterior rectus sheath with #1 Vicryl in  running fashion, 2-0 Vicryl in the subcutaneous tissues, 3-0 Vicryl  subcuticularly and 4-0 Monocryl suture in the subcuticular layer.  The  patient tolerated procedure well, was returned to recovery room in  stable condition.  Blood loss estimated at 450 mL and 20 mL of Cell  Saver blood was returned.      Stefani Dama, M.D.  Electronically Signed     HJE/MEDQ  D:  05/27/2007  T:  05/28/2007  Job:  [id number]

## 2010-08-09 NOTE — Op Note (Signed)
NAME:  Paige Black, Paige Black                 ACCOUNT NO.:  [id number]   MEDICAL RECORD NO.:  [id number]          PATIENT TYPE:  AMB   LOCATION:  [facility]                         FACILITY:  [facility]   PHYSICIAN:  Daniel L. 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   POSTOPERATIVE DIAGNOSES:  Postmenopausal bleeding with submucous myoma.   OPERATIONS:  Hysteroscopic myomectomy.   SURGEON:  Dr. Eda Paschal.   ANESTHESIA:  General.   INDICATIONS:  The patient is a 65 year old female who had presented to  the office with postmenopausal bleeding.  Endometrial biopsy was benign.  Saline infusion histogram was done because of an extremely enlarged  endometrial cavity of 23 mm, and a 4 cm intracavitary mass was seen,  although was most consistent with a submucous myoma.  It was felt that  the patient should undergo hysteroscopy, both for biopsy and excision of  the above.  She now enters the hospital for this.   FINDINGS:  External is normal, BUS is normal.  Vagina is normal.  Cervix  is clean.  Uterus is normal size and shape with first-degree prolapse.  Adnexa fails to reveal masses.  At the time of hysteroscopy, the patient  had a large myoma filling the entire endometrial cavity of about 4 cm.  It appeared to be attached posteriorly.  Once it was removed, there was  no other pathology either in the uterus or in the endocervical canal.   DESCRIPTION OF PROCEDURE:  After adequate general endotracheal  anesthesia, the patient was placed in the dorsal supine position,  prepped and draped usual sterile manner.  A single-tooth tenaculum was  placed on the anterior lip of the cervix.  The cervix was stenotic, but  with care and slow dilatation, she could be dilated to #31 dilator.  The  hysteroscopic resectoscope was then  introduced.  A 90 degree wire loop  attached to appropriate Bovie settings was attached.  A camera was used  for magnification.  Three percent sorbitol was used to expand the  intrauterine cavity.  The above findings were noted.  By carefully  chipping away at the myoma, it could be reduced in size significantly,  so that we could now see around it.  Approximately 98% of it could be  excised, posteriorly went into the wall of fundus.  Some of it in the  wall was also excised, but the surgeon's impression was that there  possibly was a small amount that remained.  In a postmenopausal woman  not on HRT, it was felt that this would not be an issue.  That area was  cauterized.  Bleeding points were also cauterized.  The entire procedure  took approximately 1-1/2 hours, and a result of that, electrolytes were  drawn near the end of it and they were completely normal.  Fluid deficit  was estimated at 600 mL because there was some fluid on the floor.  Blood loss was maybe  150 mL.  At the termination of procedure, the patient was having no  significant bleeding intrauterine.  She did sustain a laceration on the  anterior lip of the cervix which was sutured with interrupted sutures of  0 Vicryl, stopping all bleeding.  The patient left the operating in  satisfactory condition.      Daniel L. Eda Paschal, M.D.  Electronically Signed     DLG/MEDQ  D:  10/10/2007  T:  10/10/2007  Job:  [id number]

## 2010-08-12 NOTE — Discharge Summary (Signed)
NAME:  Paige Black, Paige Black NO.:  [id number]   MEDICAL RECORD NO.:  [id number]          PATIENT TYPE:  INP   LOCATION:  6737                         FACILITY:  [facility]   PHYSICIAN:  Hedwig Morton. Juanda Chance, MD     DATE OF BIRTH:  May 13, 1945   DATE OF ADMISSION:  04/11/2006  DATE OF DISCHARGE:  04/11/2006                               DISCHARGE SUMMARY   ADMITTING DIAGNOSES:  1. This is a 65 year old with intractable nausea and vomiting with      secondary dehydration.  2. Hypertension and tachycardia in this setting raising the question      of a withdrawal syndrome.  3. Question of occult ETOH abuse.  4. Abnormal liver function studies.  5. History of hypertension.  6. History of recurrent urinary tract infections and pyelonephritis.  7. Status post remote appendectomy.   DISCHARGE DIAGNOSES:  1. Nausea, vomiting, and dehydration, resolved.  Felt secondary to      ETOH withdrawal.  2. Alcoholism, stable, status post ETOH detox protocol.  3. Electrolyte abnormalities with hypokalemia, hypomagnesemia, and      hypophosphatemia, corrected.  4. Alcoholic hepatitis, improving.   BRIEF HISTORY:  Paige Black is a pleasant 65 year old white female known to Dr.  Juanda Chance and Dr. Debby Bud with history as described above.  At this time she  is admitted with intractable nausea and vomiting over the past several  days with inability to keep down p.o.  She denied any abdominal pain or  fever.  Had denied any diarrhea, melena, or hematochezia but had become  progressively weak and her daughter had called stating she was unable to  walk on the morning of admission, that she had found her at home on the  floor shaking.  The daughter did have concerns about occult alcohol use  which the patient is currently denying.  She had lost about 19 pounds  since 10/2005.  She had previously noted elevated LFTs with mild  transaminitis, hepatitis B and C serologies were done and were negative.  She has had  prior abdominal ultrasound showing some fatty changes in the  liver.  The patient was seen and evaluated in the office by Dr. Juanda Chance  on the day of admission, felt to be dehydrated, extremely weak, and  unable to ambulate.  She was tachycardic with a pulse of 124 and with a  blood pressure of 148/108.  She was transported to the hospital for  hydration and further diagnostic evaluation and will be placed on an  ETOH withdrawal protocol in the interim.   LABORATORY STUDIES ON ADMISSION:  1. WBC 4.0.  Hemoglobin 12.1.  Hematocrit 34.6.  MCV 104.9.  Platelets      121.  2. Electrolytes on admission within normal limits.  BUN less than 1.      Creatinine 0.5.  3. Total bilirubin 1.4.  Alk phos 66.  SGOT 348.  SGPT 333.  Albumin      3.3.  4. Phosphorus less than 1 on admission, this was corrected to 2.3.  5. Magnesium was actually 1.8.  6. Venous ammonia was 63, corrected to  40.  7. Hepatitis B, C, and A serologies all negative.   X-RAY STUDIES:  1. Abdominal ultrasound shows a fatty liver, unchanged left renal      atrophy.  Otherwise negative.  2. Chest x-ray showed no acute findings.   HOSPITAL COURSE:  The patient was admitted to the service of Dr. [name].  She was hydrated and placed on ETOH withdrawal protocol with  thiamine and Ativan.  She was hypokalemic with a critical potassium of  2.5.  This is corrected over the next several days.  Phosphorus was also  corrected.  The patient initially went through some withdrawal but no  seizures.  She was somewhat confused initially and in denial regarding  her alcoholism; although, she admitted to drinking several glasses of  wine per night.  Did not feel that she needed any alcohol  rehabilitation.  Her family pursued admission to [facility] and we  helped facilitate this and by 1:20 she was more alert and appropriate  and was agreeable to admission to [facility].   PLAN:  Plan at this time is for discharge with  direct admission to  [facility] for a 28-day program.  She has to get psych evaluation  with Dr. Jeanie Sewer prior to discharge.  At this time her Lexapro has  been on hold.  Her diet has advanced to a regular diet.  She was  complaining of some dysuria and was placed on Cipro 250 mg b.i.d.   OTHER MEDICATIONS AT THE TIME OF DISCHARGE:  1. Lexapro, on hold.  2. Protonix 40 mg daily.  3. Multivitamin daily.  4. Chronulac 30 mL daily.  5. Thiamine 1000 mg daily.  6. Phenergan 12.5-25 mg q.6 hours p.r.n.  7. Ativan had been weaned to a p.r.n. dose of 1 mg q.4 hours.   CONDITION ON DISCHARGE:  Stable and improved.      Amy Brooklawn, PA-C      [name] M. Juanda Chance, MD  Electronically Signed    AE/MEDQ  D:  04/16/2006  T:  04/16/2006  Job:  [id number]

## 2010-08-12 NOTE — H&P (Signed)
NAME:  SETH, FRIEDLANDER                 ACCOUNT NO.:  [id number]   MEDICAL RECORD NO.:  [id number]          PATIENT TYPE:  CINP   LOCATION:                               FACILITY:  [facility]   PHYSICIAN:  Hedwig Morton. Juanda Chance, MD     DATE OF BIRTH:  [birth year]   DATE OF ADMISSION:  04/11/2006  DATE OF DISCHARGE:                              HISTORY & PHYSICAL   CHIEF COMPLAINT:  Intractable nausea and vomiting.   HISTORY:  Lateisha is a 65 year old white female known to Dr. [name] and  also to Dr. Illene Regulus who has a history of mild hypertension,  hyperlipidemia, remote pyelonephritis, IBS and is status post  appendectomy.  She has now been having problems with intractable  vomiting over the past several days; and inability to keep p.o.'s down.  She denies any abdominal pain or fever.  Denies any diarrhea, melena, or  hematochezia.  She has become progressively weak; and her daughter  called and states that she was unable to walk this morning; and that she  had found her at home shaking.  There has been some concern about  occult alcohol abuse which the patient currently denies.  She has lost  about 19 pounds since August 2007.  She had had abnormal liver function  studies in 10/2005 with an SGOT of 58, SGPT of 59, hepatitis B and C  serologies were done; and were negative.  In July of 2006 SGOT was 158,  SGPT of 174, cholesterol was 245.  Abdominal ultrasound was done in  February of 2006 which showed some fatty changes in the liver, otherwise  negative.  The patient was seen and evaluated in the office, this  morning, by Dr. Juanda Chance and noted to be ill, extremely weak; and unable  to ambulate.  She has tachycardiac with a pulse of 124, afebrile, blood  pressure 148/108.  She is admitted currently for hydration, and further  diagnostic evaluation, rule out viral syndrome, withdrawal syndrome.   CURRENT MEDICATIONS:  Hydrochlorothiazide 25 mg daily, Lexapro 10 mg  p.o. daily, and AcipHex 20 mg  p.o. daily   ALLERGIES:  Penicillin which causes nausea and vomiting and sulfa with  questionable reaction.   PAST HISTORY:  1. Pertinent for remote appendectomy.  2. Pyelonephritis 1990.  3. Ectopic pregnancy in 1980.  4. Candida esophagitis in 1998.  5. Negative colonoscopy in November 1999.  6. D&C.  7. Hyperlipidemia.   SOCIAL HISTORY:  The patient is divorced, had undergone a difficult  divorce a couple of years ago.  She is an ex-smoker, currently denies  EtOH; is employed as a travel agent.   REVIEW OF SYSTEMS:  Currently denies any chest pain or anginal symptoms.  PULMONARY:  Denies any cough, shortness of breath or sputum production.  GENITOURINARY:  Denies dysuria, urgency or frequency.  GI:  As above.  MUSCULOSKELETAL:  The patient feels weak, but denies any specific  myalgias or arthralgias.  NEURO:  Denies headache, has had some  dizziness with ambulation.   FAMILY HISTORY:  Mother with history of hypertension and  diabetes.  Father deceased with complications of an MI.  One brother and a sister  with adult onset diabetes mellitus.  GI is negative.   PHYSICAL EXAM:  GENERAL:  Per Dr. Juanda Chance, a well-developed white female  smells of acetone.  VITAL SIGNS:  Weight is 126, blood pressure 148/80, pulse is 124.  She  is alert and oriented.  HEENT: Nontraumatic, normocephalic.  EOMI, PERRLA.  Sclerae anicteric.  NECK:  Supple without nodes.  Buccal mucosa is dry.  CARDIOVASCULAR:  Tachy, irregular rhythm with S1 and S2.  No murmur,  rub, or gallop.  PULMONARY:  Clear to A&P.  ABDOMEN:  Soft.  Liver edge is palpable down 2-3 fingerbreadths below  the right costal margin; and is nontender.  There is no other palpable  mass or hepatosplenomegaly.  RECTAL EXAM:  Reveals heme-negative stool.  EXTREMITIES:  Without clubbing, cyanosis or edema.  NEURO:  Grossly nonfocal   IMPRESSION:  40. A 65 year old white female with intractable nausea and vomiting      with secondary  dehydration.  2. Hypertension and tachycardia in this setting, raising the question      of a withdrawal syndrome.  3. Question of occult EtOH abuse.  4. History of abnormal liver function studies.  5. History of hypertension.  6. History of urinary tract infections and pyelonephritis.  7. Status post remote appendectomy.   PLAN:  The patient is admitted to the service of Dr. [name] for  base line laboratory studies, vigorous IV fluid hydration, IV PPI,  possible EGD, and abdominal ultrasound, and may need withdrawal  protocol.  For details please see the orders.      Amy [place], PA-C      [name] M. Juanda Chance, MD  Electronically Signed    AE/MEDQ  D:  04/11/2006  T:  04/11/2006  Job:  [id number]

## 2010-08-12 NOTE — Assessment & Plan Note (Signed)
[facility]                                 ON-CALL NOTE   NAME:MESCHANLilee, Aldea                        MRN:          [id number]  DATE:04/10/2006                            DOB:          1945-11-29    Alden Server paged tonight.  She is the patient's daughter.  Misty Stanley is  very concerned about her mother's health.  She has not been eating well  for some time.  She was scheduled to see Dr. Juanda Chance in the office today,  but did not go to that appointment.  She says that she believes her mom  may be an alcoholic, and that may be contributing to her problems.  She  said she found her on the floor when she went to see her today.  Currently, her mom is oriented and walking around, but the daughter is  concerned that she is not eating well.   I recommended that she take her mom to the emergency room directly.  It  is not clear what exactly is going on.  Possibly she was on the floor  simply because she has been drinking too much.  Her lack of appetite may  be a sign of peptic ulcer disease or esophagitis, possibly alcohol  hepatitis.  The daughter was not sure that she wanted to take her to the  emergency room, but if she gets more worried about her mom tonight she  will.  Her other idea was to see if Dr. Juanda Chance will be able to fit her  in the morning to be seen in the office.  I recommended again that it  sounds like things are fairly complex, and her best bet was to go  directly to the emergency room for lab tests and possibly imaging  studies, perhaps admission.     Rachael Fee, MD  Electronically Signed    DPJ/MedQ  DD: 04/10/2006  DT: 04/11/2006  Job #: [id number]

## 2010-10-07 ENCOUNTER — Other Ambulatory Visit: Payer: Self-pay | Admitting: Internal Medicine

## 2010-11-01 ENCOUNTER — Other Ambulatory Visit: Payer: Self-pay | Admitting: Internal Medicine

## 2010-12-15 ENCOUNTER — Telehealth: Payer: Self-pay | Admitting: *Deleted

## 2010-12-15 ENCOUNTER — Other Ambulatory Visit: Payer: Self-pay | Admitting: Internal Medicine

## 2010-12-15 NOTE — Telephone Encounter (Signed)
Refill request for Lorazepam 0.5 mg SIG one tablet q 6 hours prn QTY 100, Please Advise refills

## 2010-12-15 NOTE — Telephone Encounter (Signed)
Ami Bullins, CMA 12/15/2010 10:37 AM Signed  Refill request for Lorazepam 0.5 mg SIG one tablet q 6 hours prn QTY 100, Please Advise refills

## 2010-12-15 NOTE — Telephone Encounter (Signed)
Needs ov - not seen in a long tine Thx

## 2010-12-16 ENCOUNTER — Other Ambulatory Visit: Payer: Self-pay | Admitting: Internal Medicine

## 2010-12-18 NOTE — Telephone Encounter (Signed)
Refill lorazepam 0.5mg  1 q6, #100, no refills. Last OV Feb '12 was to have follow-up in 3 months - overdue.. Needs OV

## 2010-12-19 LAB — BASIC METABOLIC PANEL
CO2: 29
Chloride: 100
Creatinine, Ser: 0.75
GFR calc Af Amer: >60
GFR calc Af Amer: >60
GFR calc non Af Amer: >60
Glucose, Bld: 97
Potassium: 3.2 — ABNORMAL LOW
Potassium: 3.3 — ABNORMAL LOW
Sodium: 136

## 2010-12-19 LAB — TYPE AND SCREEN

## 2010-12-19 LAB — CBC
HCT: 31.2 — ABNORMAL LOW
HCT: 47.1 — ABNORMAL HIGH
Hemoglobin: 16.5 — ABNORMAL HIGH
MCHC: 35.2
MCV: 96.1
RBC: 3.24 — ABNORMAL LOW
RBC: 4.92
RDW: 13
WBC: 8

## 2010-12-19 LAB — ABO/RH: ABO/RH(D): A NEG

## 2010-12-19 MED ORDER — LORAZEPAM 0.5 MG PO TABS: 0.5000 mg | ORAL_TABLET | Freq: Two times a day (BID) | ORAL | Status: DC

## 2010-12-19 NOTE — Telephone Encounter (Signed)
Pt informed follow up appointment needed for further refills

## 2010-12-23 LAB — POCT I-STAT, CHEM 8
BUN: 11
Calcium, Ion: 1.15
Creatinine, Ser: 0.8
Glucose, Bld: 107 — ABNORMAL HIGH
Hemoglobin: 14.3
Sodium: 134 — ABNORMAL LOW
TCO2: 25

## 2010-12-30 LAB — URINALYSIS, ROUTINE W REFLEX MICROSCOPIC
Bilirubin Urine: NEGATIVE
Glucose, UA: NEGATIVE
Hgb urine dipstick: NEGATIVE
Ketones, ur: 15 — AB
Protein, ur: NEGATIVE
Urobilinogen, UA: 1

## 2011-01-02 ENCOUNTER — Other Ambulatory Visit: Payer: Self-pay | Admitting: Internal Medicine

## 2011-01-03 ENCOUNTER — Ambulatory Visit (INDEPENDENT_AMBULATORY_CARE_PROVIDER_SITE_OTHER): Payer: Medicare Other | Admitting: Internal Medicine

## 2011-01-03 VITALS — BP 118/92 | HR 91 | Temp 98.7°F | Wt 145.0 lb

## 2011-01-03 DIAGNOSIS — Z23 Encounter for immunization: Secondary | ICD-10-CM

## 2011-01-03 DIAGNOSIS — F1010 Alcohol abuse, uncomplicated: Secondary | ICD-10-CM

## 2011-01-03 DIAGNOSIS — R209 Unspecified disturbances of skin sensation: Secondary | ICD-10-CM

## 2011-01-03 DIAGNOSIS — I10 Essential (primary) hypertension: Secondary | ICD-10-CM

## 2011-01-03 DIAGNOSIS — F341 Dysthymic disorder: Secondary | ICD-10-CM

## 2011-01-03 MED ORDER — GABAPENTIN 100 MG PO CAPS: ORAL_CAPSULE | ORAL | Status: DC

## 2011-01-03 MED ORDER — LORAZEPAM 0.5 MG PO TABS: 0.5000 mg | ORAL_TABLET | Freq: Two times a day (BID) | ORAL | Status: DC

## 2011-01-03 MED ORDER — TETANUS-DIPHTH-ACELL PERTUSSIS 5-2.5-18.5 LF-MCG/0.5 IM SUSP: 0.5000 mL | Freq: Once | INTRAMUSCULAR | Status: DC

## 2011-01-04 NOTE — Assessment & Plan Note (Signed)
BP Readings from Last 3 Encounters:  01/03/11 118/92  04/28/10 138/82  01/27/10 142/98   Good control.

## 2011-01-04 NOTE — Assessment & Plan Note (Signed)
Post HNP neuropathy.  Plan - instructed to take gabapentin 200 mg tid on schedule for best results.

## 2011-01-04 NOTE — Assessment & Plan Note (Signed)
Appears to be stable.  

## 2011-01-04 NOTE — Progress Notes (Signed)
  Subjective:    Patient ID: Paige Black, female    DOB: September 28, 1945, 65 y.o.   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 She has made progress and her pain is better than in the past. She has been using gabapentin prn.  She is abstemious 10 weeks. This has been an on-going battle. She is not in a recovery program but is doing this on her own. She has followed with Dr. Juanda Chance and is stable from a GI perspective.   Past Medical History  Diagnosis Date  . Anxiety   . Depression   . Hypertension   . H/O: alcohol abuse 2008    28 days at [facility]  . Hepatitis, alcoholic   . History of acute pyelonephritis   . Cystitis, chronic   . Spinal stenosis, lumbar     congenital anomalous L9 vertebra  . Radiculopathy of sacral and sacrococcygeal region     S1-5 with weakness and paresthesia right foot  . Fibroids     Uterine   Past Surgical History  Procedure Date  . Appendectomy   . Tonsillectomy   . Salpingectomy     LT - ectopic pregnancy  . Lumbar fusion 05/2007    L5-6 L6-S1  . Myomectomy 09/2007    Uterine Fibroids  . Renal artery stent placement    Family History  Problem Relation Age of Onset  . Diabetes Mother   . Hypertension Mother   . Heart disease Father     CAD, MI, CHF, AAA  . Hypertension Father   . Dementia Father   . Diabetes Father   . Cancer Paternal Aunt     Breast  . Irritable bowel syndrome Other    History   Social History  . Marital Status: Divorced    Spouse Name: N/A    Number of Children: N/A  . Years of Education: N/A   Occupational History  . Not on file.   Social History Main Topics  . Smoking status: Not on file  . Smokeless tobacco: Not on file  . Alcohol Use:   . Drug Use:   . Sexually Active:    Other Topics Concern  . 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 3 daughters  Paige Black and Paige Black, her  oldest has been diagnosed with breast cancer (DCIS) but is doing well with treatment. 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 $$ stressed with history of alcohol excess.       Review of Systems System review is negative for any constitutional, cardiac, pulmonary, GI or neuro symptoms or complaints other than as described in the HPI.     Objective:   Physical Exam Vitals - stable Gen'l WNWD white woman in no distress HEENT- [place]/AT, EACs with cerumen. Oropharynx normal. Chest - no CVAT. Lungs - CTAP Cor - RRR Neuro - A&O x 3, no tremor, no weakness. Normal gait.        Assessment & Plan:

## 2011-01-04 NOTE — Assessment & Plan Note (Signed)
Reports that she is abstemious x 10 weeks. She acknowledges her problem  Plan - encouraged abstention from use 100%           AA may be of benefit.

## 2011-02-27 ENCOUNTER — Encounter: Payer: Self-pay | Admitting: Obstetrics and Gynecology

## 2011-03-28 DIAGNOSIS — D126 Benign neoplasm of colon, unspecified: Secondary | ICD-10-CM

## 2011-03-28 HISTORY — DX: Benign neoplasm of colon, unspecified: D12.6

## 2011-05-06 ENCOUNTER — Other Ambulatory Visit: Payer: Self-pay | Admitting: Internal Medicine

## 2011-05-24 ENCOUNTER — Encounter: Payer: Self-pay | Admitting: Internal Medicine

## 2011-05-24 ENCOUNTER — Ambulatory Visit (INDEPENDENT_AMBULATORY_CARE_PROVIDER_SITE_OTHER): Payer: Medicare Other | Admitting: Internal Medicine

## 2011-05-24 ENCOUNTER — Ambulatory Visit: Payer: Medicare Other

## 2011-05-24 ENCOUNTER — Other Ambulatory Visit (INDEPENDENT_AMBULATORY_CARE_PROVIDER_SITE_OTHER): Payer: Medicare Other

## 2011-05-24 DIAGNOSIS — N951 Menopausal and female climacteric states: Secondary | ICD-10-CM

## 2011-05-24 DIAGNOSIS — G629 Polyneuropathy, unspecified: Secondary | ICD-10-CM

## 2011-05-24 DIAGNOSIS — B3781 Candidal esophagitis: Secondary | ICD-10-CM

## 2011-05-24 DIAGNOSIS — R209 Unspecified disturbances of skin sensation: Secondary | ICD-10-CM

## 2011-05-24 DIAGNOSIS — F1010 Alcohol abuse, uncomplicated: Secondary | ICD-10-CM

## 2011-05-24 DIAGNOSIS — R259 Unspecified abnormal involuntary movements: Secondary | ICD-10-CM

## 2011-05-24 DIAGNOSIS — R739 Hyperglycemia, unspecified: Secondary | ICD-10-CM

## 2011-05-24 DIAGNOSIS — R7309 Other abnormal glucose: Secondary | ICD-10-CM

## 2011-05-24 DIAGNOSIS — R251 Tremor, unspecified: Secondary | ICD-10-CM

## 2011-05-24 DIAGNOSIS — G589 Mononeuropathy, unspecified: Secondary | ICD-10-CM

## 2011-05-24 DIAGNOSIS — R232 Flushing: Secondary | ICD-10-CM

## 2011-05-24 DIAGNOSIS — I10 Essential (primary) hypertension: Secondary | ICD-10-CM

## 2011-05-24 DIAGNOSIS — Z8719 Personal history of other diseases of the digestive system: Secondary | ICD-10-CM

## 2011-05-24 DIAGNOSIS — K7689 Other specified diseases of liver: Secondary | ICD-10-CM

## 2011-05-24 DIAGNOSIS — F341 Dysthymic disorder: Secondary | ICD-10-CM

## 2011-05-24 LAB — T4, FREE: Free T4: 0.62 ng/dL (ref 0.60–1.60)

## 2011-05-24 LAB — COMPREHENSIVE METABOLIC PANEL
AST: 144 U/L — ABNORMAL HIGH (ref 0–37)
Albumin: 4.7 g/dL (ref 3.5–5.2)
Alkaline Phosphatase: 52 U/L (ref 39–117)
Calcium: 9.9 mg/dL (ref 8.4–10.5)
Chloride: 101 mEq/L (ref 96–112)
Glucose, Bld: 92 mg/dL (ref 70–99)
Potassium: 4.4 mEq/L (ref 3.5–5.1)
Sodium: 138 mEq/L (ref 135–145)
Total Protein: 7.6 g/dL (ref 6.0–8.3)

## 2011-05-24 LAB — T3, FREE: T3, Free: 2.7 pg/mL (ref 2.3–4.2)

## 2011-05-24 LAB — CBC WITH DIFFERENTIAL/PLATELET
Basophils Absolute: 0 10*3/uL (ref 0.0–0.1)
Eosinophils Relative: 0.4 % (ref 0.0–5.0)
HCT: 40.1 % (ref 36.0–46.0)
Hemoglobin: 13.8 g/dL (ref 12.0–15.0)
Lymphocytes Relative: 27.6 % (ref 12.0–46.0)
Lymphs Abs: 1.3 10*3/uL (ref 0.7–4.0)
Monocytes Relative: 5.8 % (ref 3.0–12.0)
Neutro Abs: 3 10*3/uL (ref 1.4–7.7)
RBC: 3.95 Mil/uL (ref 3.87–5.11)
RDW: 14.1 % (ref 11.5–14.6)
WBC: 4.6 10*3/uL (ref 4.5–10.5)

## 2011-05-24 NOTE — Progress Notes (Signed)
Subjective:    Patient ID: Paige Black, female    DOB: May 23, 1945, 66 y.o.   MRN: [id number]  HPI Mrs Klutz presents for evaluation of progressive symtoms of hot flashes then cold feelings, tremor that involves her whole body, weakness that is global, increased emotionality. She has had decreased appetite. She denies chest pain, shortness of breath, abdominal pain, change in bowel or bladder habit. She is not more depressed than usual. She is working her recovery - not smoking or drinking. Her weight has been stable. No lumps or nodes. No focal joint pain or focal weakness. Chart reviewed: last TSH '11, last A1C '09  Past Medical History  Diagnosis Date  . Anxiety   . Depression   . Hypertension   . H/O: alcohol abuse 2008    28 days at [facility]  . Hepatitis, alcoholic   . History of acute pyelonephritis   . Cystitis, chronic   . Spinal stenosis, lumbar     congenital anomalous L9 vertebra  . Radiculopathy of sacral and sacrococcygeal region     S1-5 with weakness and paresthesia right foot  . Fibroids     Uterine   Past Surgical History  Procedure Date  . Appendectomy   . Tonsillectomy   . Salpingectomy     LT - ectopic pregnancy  . Lumbar fusion 05/2007    L5-6 L6-S1  . Myomectomy 09/2007    Uterine Fibroids  . Renal artery stent placement    Family History  Problem Relation Age of Onset  . Diabetes Mother   . Hypertension Mother   . Heart disease Father     CAD, MI, CHF, AAA  . Hypertension Father   . Dementia Father   . Diabetes Father   . Cancer Paternal Aunt     Breast  . Irritable bowel syndrome Other    History   Social History  . Marital Status: Divorced    Spouse Name: N/A    Number of Children: N/A  . Years of Education: N/A   Occupational History  . Not on file.   Social History Main Topics  . Smoking status: Former Games developer  . Smokeless tobacco: Not on file  . Alcohol Use: Not on file  . Drug Use: Not on file  . Sexually Active:  Not on file   Other Topics Concern  . 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 3 daughters  Maxwell Caul and Misty Stanley, her oldest has been diagnosed with breast cancer (DCIS) but is doing well with treatment. 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 $$ stressed with history of alcohol excess.       Review of Systems System review is negative for any constitutional, cardiac, pulmonary, GI or neuro symptoms or complaints other than as described in the HPI.     Objective:   Physical Exam Filed Vitals:   05/24/11 0938  BP: 142/92  Pulse: 72  Temp: 98.6 F (37 C)  Resp: 16   Wt Readings from Last 3 Encounters:  05/24/11 144 lb (65.318 kg)  01/03/11 145 lb (65.772 kg)  04/28/10 148 lb (67.132 kg)   Gen'l- WNWD white woman who is emotional and tremoring HEENT- C&S clear, PERRLA, EOMI Neck- normal thyroid without nodules or tenderness Nodes - negative in submandibular, cerivcal, supraclavicular, axillary regions Pulm - normal respirations w/o increased WOB Cor - 2+ radial pulse, RRR w/o murmur Abd- soft, BS+ Neuro - A&O  x 3, CN II-XII normal, DTR's 3+ with poor extinction. No cog-wheeling. Notable tremor UE and LE. Skin - minimal tenting, not dry but she uses a lot of moisturizing lotions Psych - a little fragil and tearful.   Lab Results  Component Value Date   WBC 4.6 05/24/2011   HGB 13.8 05/24/2011   HCT 40.1 05/24/2011   PLT 223.0 05/24/2011   GLUCOSE 92 05/24/2011   CHOL 224* 09/15/2008   TRIG 98.0 09/15/2008   HDL 102.00 09/15/2008   LDLDIRECT 109.1 09/15/2008   ALT 107* 05/24/2011   AST 144* 05/24/2011   NA 138 05/24/2011   K 4.4 05/24/2011   CL 101 05/24/2011   CREATININE 0.7 05/24/2011   BUN 14 05/24/2011   CO2 30 05/24/2011   TSH 0.97 05/24/2011   INR 0.9 ratio 02/21/2010   HGBA1C 5.9 05/24/2011       FT4                        0.97      T3 free                   2.7       Assessment & Plan:  Tremor with autonomic symptoms -  normal thyroid functions, normal A1C, normal B12, chemistries are normal except for elevation in transaminases - may be withdrawal.  Plan- EtOH level          Return OV

## 2011-05-25 ENCOUNTER — Other Ambulatory Visit: Payer: Medicare Other

## 2011-05-25 DIAGNOSIS — Z8719 Personal history of other diseases of the digestive system: Secondary | ICD-10-CM

## 2011-05-25 NOTE — Assessment & Plan Note (Signed)
Mild elevation in LFTs: AST 30 to 144; ALT 25 to 107. No jaundice. No fetor hepaticus. No abdominal tenderness

## 2011-05-25 NOTE — Assessment & Plan Note (Signed)
Reports that she is abstemious. Weight is stable.  Plan - EtOH level.

## 2011-05-25 NOTE — Assessment & Plan Note (Signed)
Mild elevation in LFTs - see above.

## 2011-05-25 NOTE — Assessment & Plan Note (Signed)
Depressed affect with high degree of anxiety. She reports that she is upset after a bad interaction with Gavin Pound. She reports that she is still taking zoloft and prn alprazolam.

## 2011-05-28 ENCOUNTER — Encounter: Payer: Self-pay | Admitting: Internal Medicine

## 2011-05-29 ENCOUNTER — Telehealth: Payer: Self-pay | Admitting: *Deleted

## 2011-05-29 NOTE — Telephone Encounter (Signed)
Test Results request. Letter mailed 03.03.13

## 2011-06-28 ENCOUNTER — Other Ambulatory Visit: Payer: Self-pay | Admitting: Internal Medicine

## 2011-06-29 ENCOUNTER — Other Ambulatory Visit: Payer: Self-pay | Admitting: Internal Medicine

## 2011-06-30 ENCOUNTER — Other Ambulatory Visit: Payer: Medicare Other

## 2011-06-30 ENCOUNTER — Other Ambulatory Visit: Payer: Self-pay

## 2011-06-30 DIAGNOSIS — R350 Frequency of micturition: Secondary | ICD-10-CM

## 2011-06-30 DIAGNOSIS — R1030 Lower abdominal pain, unspecified: Secondary | ICD-10-CM

## 2011-06-30 LAB — URINALYSIS, ROUTINE W REFLEX MICROSCOPIC
Nitrite: NEGATIVE
Urine Glucose: NEGATIVE
pH: 6 (ref 5.0–8.0)

## 2011-06-30 MED ORDER — NITROFURANTOIN MACROCRYSTAL 100 MG PO CAPS: 100.0000 mg | ORAL_CAPSULE | Freq: Two times a day (BID) | ORAL | Status: AC

## 2011-06-30 MED ORDER — FLUCONAZOLE 100 MG PO TABS: 100.0000 mg | ORAL_TABLET | Freq: Every day | ORAL | Status: AC

## 2011-07-06 ENCOUNTER — Other Ambulatory Visit: Payer: Self-pay | Admitting: Internal Medicine

## 2011-07-06 NOTE — Telephone Encounter (Signed)
Zoloft request [last refill 10.08.12 #[id number]] Please advise.

## 2011-07-14 ENCOUNTER — Other Ambulatory Visit: Payer: Medicare Other

## 2011-07-14 DIAGNOSIS — R350 Frequency of micturition: Secondary | ICD-10-CM

## 2011-07-14 LAB — URINALYSIS, ROUTINE W REFLEX MICROSCOPIC
Bilirubin Urine: NEGATIVE
Hgb urine dipstick: NEGATIVE
Ketones, ur: NEGATIVE
Urobilinogen, UA: 1 (ref 0.0–1.0)

## 2011-07-19 ENCOUNTER — Other Ambulatory Visit: Payer: Self-pay | Admitting: Internal Medicine

## 2011-07-25 ENCOUNTER — Other Ambulatory Visit: Payer: Self-pay | Admitting: *Deleted

## 2011-07-25 MED ORDER — SERTRALINE HCL 100 MG PO TABS: 200.0000 mg | ORAL_TABLET | Freq: Every day | ORAL | Status: DC

## 2011-07-25 MED ORDER — LORAZEPAM 0.5 MG PO TABS: 0.5000 mg | ORAL_TABLET | Freq: Two times a day (BID) | ORAL | Status: DC

## 2011-07-26 ENCOUNTER — Telehealth: Payer: Self-pay | Admitting: *Deleted

## 2011-07-26 DIAGNOSIS — IMO0002 Reserved for concepts with insufficient information to code with codable children: Secondary | ICD-10-CM

## 2011-07-26 NOTE — Telephone Encounter (Signed)
Patient states she is in extreme pain right leg and is urgent she get a referral to Dr. Melbourne Abts , who she has seen 3 years ago. She called their office to get appointment they told her she need referral from a doctor who has seen her recently. Patient states she does not want to see anyone else but Dr. Sandria Manly. Patient number is [phone]. Patient states will be most u pset if this is not done. Paige Black

## 2011-07-26 NOTE — Telephone Encounter (Signed)
meds sent to [facility] for refill.

## 2011-07-27 ENCOUNTER — Telehealth: Payer: Self-pay | Admitting: *Deleted

## 2011-07-27 NOTE — Telephone Encounter (Signed)
Referral order placed with [facility] 

## 2011-07-27 NOTE — Telephone Encounter (Signed)
Spoke with Herbert Seta and she will fax referral form over to be completed.

## 2011-07-27 NOTE — Telephone Encounter (Signed)
Patient notified of referral and [facility] will call her.

## 2011-07-27 NOTE — Telephone Encounter (Signed)
Rene Kocher, can you, please make a referral ro Dr Melbourne Abts for Paige Black, she has increased pain in left foot. He is her neurologist. She had surgery by Dr Danielle Dess, she has had a sudden deterioration of the chronic foot pain.      Left a message with scheduler re: appointment for patient.

## 2011-07-27 NOTE — Telephone Encounter (Signed)
Faxed referral form and last labs, OV notes to Dr. Imagene Gurney office.

## 2011-08-14 ENCOUNTER — Encounter (HOSPITAL_COMMUNITY)
Admission: AD | Disposition: A | Discharge status: Home / Self Care | Payer: Self-pay | Source: Ambulatory Visit | Attending: Gastroenterology

## 2011-08-14 ENCOUNTER — Telehealth: Payer: Self-pay | Admitting: Internal Medicine

## 2011-08-14 ENCOUNTER — Ambulatory Visit (INDEPENDENT_AMBULATORY_CARE_PROVIDER_SITE_OTHER): Payer: Medicare Other | Admitting: Physician Assistant

## 2011-08-14 ENCOUNTER — Inpatient Hospital Stay (HOSPITAL_COMMUNITY)
Admission: AD | Admit: 2011-08-14 | Discharge: 2011-08-17 | DRG: 378 | Disposition: A | Discharge status: Home / Self Care | Payer: Medicare Other | Source: Ambulatory Visit | Attending: Gastroenterology | Admitting: Gastroenterology

## 2011-08-14 ENCOUNTER — Encounter (HOSPITAL_COMMUNITY): Payer: Self-pay | Admitting: *Deleted

## 2011-08-14 VITALS — BP 82/53 | HR 119 | Ht 65.0 in | Wt 139.0 lb

## 2011-08-14 DIAGNOSIS — K264 Chronic or unspecified duodenal ulcer with hemorrhage: Principal | ICD-10-CM

## 2011-08-14 DIAGNOSIS — G609 Hereditary and idiopathic neuropathy, unspecified: Secondary | ICD-10-CM | POA: Diagnosis present

## 2011-08-14 DIAGNOSIS — K922 Gastrointestinal hemorrhage, unspecified: Secondary | ICD-10-CM

## 2011-08-14 DIAGNOSIS — F411 Generalized anxiety disorder: Secondary | ICD-10-CM | POA: Diagnosis present

## 2011-08-14 DIAGNOSIS — K219 Gastro-esophageal reflux disease without esophagitis: Secondary | ICD-10-CM | POA: Diagnosis present

## 2011-08-14 DIAGNOSIS — K7689 Other specified diseases of liver: Secondary | ICD-10-CM

## 2011-08-14 DIAGNOSIS — F3289 Other specified depressive episodes: Secondary | ICD-10-CM | POA: Diagnosis present

## 2011-08-14 DIAGNOSIS — F1010 Alcohol abuse, uncomplicated: Secondary | ICD-10-CM

## 2011-08-14 DIAGNOSIS — K209 Esophagitis, unspecified without bleeding: Secondary | ICD-10-CM | POA: Diagnosis present

## 2011-08-14 DIAGNOSIS — F329 Major depressive disorder, single episode, unspecified: Secondary | ICD-10-CM | POA: Diagnosis present

## 2011-08-14 DIAGNOSIS — D62 Acute posthemorrhagic anemia: Secondary | ICD-10-CM | POA: Diagnosis present

## 2011-08-14 LAB — PROTIME-INR
INR: 1.01 (ref 0.00–1.49)
Prothrombin Time: 13.5 seconds (ref 11.6–15.2)

## 2011-08-14 LAB — COMPREHENSIVE METABOLIC PANEL
Albumin: 3.9 g/dL (ref 3.5–5.2)
BUN: 49 mg/dL — ABNORMAL HIGH (ref 6–23)
Chloride: 97 mEq/L (ref 96–112)
Creatinine, Ser: 1.04 mg/dL (ref 0.50–1.10)
Total Bilirubin: 0.5 mg/dL (ref 0.3–1.2)
Total Protein: 6.5 g/dL (ref 6.0–8.3)

## 2011-08-14 LAB — CBC
HCT: 29.9 % — ABNORMAL LOW (ref 36.0–46.0)
Hemoglobin: 10.6 g/dL — ABNORMAL LOW (ref 12.0–15.0)
MCH: 34.8 pg — ABNORMAL HIGH (ref 26.0–34.0)
MCV: 98 fL (ref 78.0–100.0)
RBC: 3.05 MIL/uL — ABNORMAL LOW (ref 3.87–5.11)

## 2011-08-14 SURGERY — EGD (ESOPHAGOGASTRODUODENOSCOPY)
Anesthesia: Moderate Sedation

## 2011-08-14 MED ORDER — ZOLPIDEM TARTRATE 5 MG PO TABS
5.0000 mg | ORAL_TABLET | Freq: Once | ORAL | Status: AC
  Administered 2011-08-14: 5 mg via ORAL
  Filled 2011-08-14: qty 1

## 2011-08-14 MED ORDER — ONDANSETRON HCL 4 MG/2ML IJ SOLN: 4.0000 mg | Freq: Four times a day (QID) | INTRAMUSCULAR | Status: DC | PRN

## 2011-08-14 MED ORDER — ACETAMINOPHEN 325 MG PO TABS: 650.0000 mg | ORAL_TABLET | Freq: Four times a day (QID) | ORAL | Status: DC | PRN

## 2011-08-14 MED ORDER — SODIUM CHLORIDE 0.9 % IV BOLUS (SEPSIS)
500.0000 mL | Freq: Once | INTRAVENOUS | Status: AC
  Administered 2011-08-14: 500 mL via INTRAVENOUS

## 2011-08-14 MED ORDER — DEXTROSE-NACL 5-0.45 % IV SOLN
INTRAVENOUS | Status: DC
  Administered 2011-08-14: 18:00:00 via INTRAVENOUS

## 2011-08-14 MED ORDER — ACETAMINOPHEN 650 MG RE SUPP: 650.0000 mg | Freq: Four times a day (QID) | RECTAL | Status: DC | PRN

## 2011-08-14 MED ORDER — PANTOPRAZOLE SODIUM 40 MG IV SOLR
40.0000 mg | Freq: Two times a day (BID) | INTRAVENOUS | Status: DC
  Administered 2011-08-14: 40 mg via INTRAVENOUS
  Filled 2011-08-14 (×3): qty 40

## 2011-08-14 MED ORDER — ONDANSETRON HCL 4 MG PO TABS: 4.0000 mg | ORAL_TABLET | Freq: Four times a day (QID) | ORAL | Status: DC | PRN

## 2011-08-14 MED ORDER — SODIUM CHLORIDE 0.45 % IV SOLN: Freq: Once | INTRAVENOUS | Status: DC

## 2011-08-14 MED ORDER — HYDROCODONE-ACETAMINOPHEN 5-325 MG PO TABS: 1.0000 | ORAL_TABLET | ORAL | Status: DC | PRN

## 2011-08-14 NOTE — Telephone Encounter (Signed)
Pt states that she has been gagging last night and today and that she spit up some blood tinged mucous. Reports having black stools. Requesting to be seen. Pt scheduled to see Amy Esterwood PA today at 3pm. Pt aware of appt date and time.

## 2011-08-14 NOTE — Progress Notes (Addendum)
Patient ID: Paige Black, female   DOB: [birth year], 66 y.o.   MRN: [id number]  Paige Black is a 66 year old female, known to Dr. [name], who is seen as an urgent add-on today with complaints of nausea vomiting and black stools. She was hypotensive in the office with blood pressure 82/53 and pulse 119 and found to have black tarry stool on exam. She is admitted to [facility] with an acute upper GI bleed and for details please see the history and physical.  Addendum: Reviewed and agree with management. Pt being transported to hospital with acute GI bleeding. Beverley Fiedler, MD

## 2011-08-14 NOTE — H&P (Signed)
Primary Care Physician:  Illene Regulus, MD, MD Primary Gastroenterologist:  Dr.[name] Juanda Chance  CHIEF COMPLAINT:  Nausea vomiting and black stools  HPI: Paige Black is a 66 y.o. female known to Dr. [name] who has not been seen by GI for several years. She is a primary patient of Dr. Debby Bud. She was last seen by Dr. Debby Bud  in February. At that time she had denied any EtOH use but had an EtOH level of 278. She does have previous history of EtOH abuse and has been through EtOH rehabilitation. Recently she's been having difficulty with neuropathy in both of her lower extremities. She also has history of spinal stenosis ,anxiety and depression. She has  had remote colonoscopy and endoscopy in 1998 and 1999 which were negative with the exception of some Candida esophagitis.  She says she's been taking about 4 Advil per day over the past week because of pain in her legs and also had an increase in her Neurontin. She denies any other NSAID or aspirin use. She does admit to some alcohol use but says she has not been drinking in excess,and only wine. She comes in today with onset of vomiting yesterday at about 3 PM. She says she's had multiple episodes of emesis and has brought up some dark brown and black material but no bright red blood she says she was unable to sleep all night last night due to vomiting.  About 3:30 AM had a partially brown bowel movement followed by dark black stool and then had a second episode about 9:30 this morning with a large black bowel movement. She has not had any bowel movement since. She denies any abdominal pain continues to feel very nauseated. She's also been lightheaded and dizziness with standing today. She denies any chest pain or shortness of breath. Blood pressure in the office today is 82/53 pulse 120   Past Medical History  Diagnosis Date  . Anxiety   . Depression   . Hypertension   . H/O: alcohol abuse 2008    28 days at [facility]  . Hepatitis,  alcoholic   . History of acute pyelonephritis   . Cystitis, chronic   . Spinal stenosis, lumbar     congenital anomalous L9 vertebra  . Radiculopathy of sacral and sacrococcygeal region     S1-5 with weakness and paresthesia right foot  . Fibroids     Uterine    Past Surgical History  Procedure Date  . Appendectomy   . Tonsillectomy   . Salpingectomy     LT - ectopic pregnancy  . Lumbar fusion 05/2007    L5-6 L6-S1  . Myomectomy 09/2007    Uterine Fibroids  . Renal artery stent placement     Prior to Admission medications   Medication Sig Start Date End Date Taking? Authorizing Provider  gabapentin (NEURONTIN) 100 MG capsule Take two capsules three times 01/03/11 01/03/12 Yes Jacques Navy, MD  LORazepam (ATIVAN) 0.5 MG tablet Take 1 tablet (0.5 mg total) by mouth 2 (two) times daily. 07/25/11  Yes Jacques Navy, MD  losartan (COZAAR) 100 MG tablet TAKE ONE TABLET EVERY DAY 05/06/11  Yes Jacques Navy, MD  omeprazole (PRILOSEC) 20 MG capsule TAKE ONE CAPSULE EACH DAY 12/15/10  Yes Jacques Navy, MD  sertraline (ZOLOFT) 100 MG tablet Take 2 tablets (200 mg total) by mouth daily. 07/25/11  Yes Jacques Navy, MD    No current facility-administered medications for this visit.   No  current outpatient prescriptions on file.    Allergies as of 08/14/2011 - Review Complete 08/14/2011  Allergen Reaction Noted  . Penicillins    . Sulfonamide derivatives      Family History  Problem Relation Age of Onset  . Diabetes Mother   . Hypertension Mother   . Heart disease Father     CAD, MI, CHF, AAA  . Hypertension Father   . Dementia Father   . Diabetes Father   . Cancer Paternal Aunt     Breast  . Irritable bowel syndrome Sister     History   Social History  . Marital Status: Divorced    Spouse Name: N/A    Number of Children: 3  . Years of Education: N/A   Occupational History  . Retired    Social History Main Topics  . Smoking status: Former Games developer  .  Smokeless tobacco: Never Used  . Alcohol Use: Yes     wine  . Drug Use: No  . Sexually Active: Not on file   Other Topics Concern  . 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 3 daughters  Maxwell Caul and Misty Stanley, her oldest has been diagnosed with breast cancer (DCIS) but is doing well with treatment. 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 $$ stressed with history of alcohol excess.    Review of Systems; Pertinent positive and negative review of systems were noted in the above HPI section.  All other review of systems was otherwise negative. Physical Exam: Vital signs in last 24 hours: @VSRANGES @   General:   Alert,  Well-developed, well-nourished, pleasant and cooperative in NAD,anxious Head:  Normocephalic and atraumatic. Eyes:  Sclera clear, no icterus.   Conjunctiva pale Ears:  Normal auditory acuity. Nose:  No deformity, discharge,  or lesions. Mouth:  No deformity or lesions.  Oropharynx pink & moist. Neck:  Supple; no masses or thyromegaly. Lungs:  Clear throughout to auscultation.   No wheezes, crackles, or rhonchi. No acute distress. Heart: tachy Regular rate and rhythm; no murmurs, clicks, rubs,  or gallops. Abdomen:  Soft, nontender and nondistended. No masses, hepatosplenomegaly or hernias noted. Normal bowel sounds, without guarding, and without rebound.   Rectal:tarry black stool Hemoccult positive Msk:  Symmetrical without gross deformities. Normal posture. Pulses:  Normal pulses noted. Extremities:  Without clubbing or edema. Neurologic:  Alert and  oriented x4;  grossly normal neurologically. Skin:  Intact without significant lesions or rashes. . Psych:  Alert and cooperative. Normal mood and affect.anxious and tearful  Intake/Output from previous day:   Intake/Output this shift: @IOTHISSHIFT @  Lab Results:   Studies/Results:  Impression / Plan:   #28 66 year old white female with acute upper GI bleed, rule out  NSAID-induced peptic ulcer disease, possible Mallory-Weiss tear, possible portal gastropathy. #2 hypotension secondary to above #3 history of EtOH abuse without previous diagnosis of cirrhosis #4chronic anxiety/depression #5 peripheral neuropathy  Plan; Patient is admitted to [facility] on the GI service. Will give fluid bolus and then continue volume replacement. Stat labs and serial hemoglobins with transfusion as indicated IV protonix q12 hours Patient will need upper endoscopy, perhaps this evening depending on results of labs and response to fluid bolus. Watch for EtOH withdrawal, check EtOH level on admission. For details please see the orders.     @RRHLOS @  Jamesmichael Shadd  08/14/2011, 4:43 PM

## 2011-08-15 ENCOUNTER — Encounter (HOSPITAL_COMMUNITY)
Admission: AD | Disposition: A | Discharge status: Home / Self Care | Payer: Self-pay | Source: Ambulatory Visit | Attending: Gastroenterology

## 2011-08-15 ENCOUNTER — Encounter (HOSPITAL_COMMUNITY): Payer: Self-pay | Admitting: *Deleted

## 2011-08-15 DIAGNOSIS — K264 Chronic or unspecified duodenal ulcer with hemorrhage: Secondary | ICD-10-CM

## 2011-08-15 DIAGNOSIS — K269 Duodenal ulcer, unspecified as acute or chronic, without hemorrhage or perforation: Secondary | ICD-10-CM | POA: Insufficient documentation

## 2011-08-15 HISTORY — PX: ESOPHAGOGASTRODUODENOSCOPY: SHX5428

## 2011-08-15 HISTORY — DX: Duodenal ulcer, unspecified as acute or chronic, without hemorrhage or perforation: K26.9

## 2011-08-15 LAB — GLUCOSE, CAPILLARY: Glucose-Capillary: 125 mg/dL — ABNORMAL HIGH (ref 70–99)

## 2011-08-15 LAB — CBC
HCT: 27.7 % — ABNORMAL LOW (ref 36.0–46.0)
MCH: 34.4 pg — ABNORMAL HIGH (ref 26.0–34.0)
MCHC: 34.3 g/dL (ref 30.0–36.0)
MCV: 100.4 fL — ABNORMAL HIGH (ref 78.0–100.0)
RDW: 12.7 % (ref 11.5–15.5)

## 2011-08-15 LAB — BASIC METABOLIC PANEL
CO2: 27 mEq/L (ref 19–32)
Glucose, Bld: 122 mg/dL — ABNORMAL HIGH (ref 70–99)
Potassium: 3.7 mEq/L (ref 3.5–5.1)
Sodium: 135 mEq/L (ref 135–145)

## 2011-08-15 SURGERY — EGD (ESOPHAGOGASTRODUODENOSCOPY)
Anesthesia: Moderate Sedation

## 2011-08-15 MED ORDER — GLYCOPYRROLATE 0.2 MG/ML IJ SOLN
INTRAMUSCULAR | Status: AC
  Filled 2011-08-15: qty 1

## 2011-08-15 MED ORDER — MIDAZOLAM HCL 10 MG/2ML IJ SOLN
INTRAMUSCULAR | Status: DC | PRN
  Administered 2011-08-15 (×3): 2.5 mg via INTRAVENOUS

## 2011-08-15 MED ORDER — MIDAZOLAM HCL 10 MG/2ML IJ SOLN
INTRAMUSCULAR | Status: AC
  Filled 2011-08-15: qty 2

## 2011-08-15 MED ORDER — FENTANYL NICU IV SYRINGE 50 MCG/ML
INJECTION | INTRAMUSCULAR | Status: DC | PRN
  Administered 2011-08-15 (×3): 25 ug via INTRAVENOUS

## 2011-08-15 MED ORDER — ONDANSETRON HCL 4 MG/2ML IJ SOLN
INTRAMUSCULAR | Status: AC
  Administered 2011-08-15: 4 mg
  Filled 2011-08-15: qty 2

## 2011-08-15 MED ORDER — GLYCOPYRROLATE 0.2 MG/ML IJ SOLN
INTRAMUSCULAR | Status: DC | PRN
  Administered 2011-08-15: 0.2 mg via INTRAVENOUS

## 2011-08-15 MED ORDER — FENTANYL CITRATE 0.05 MG/ML IJ SOLN
INTRAMUSCULAR | Status: AC
  Filled 2011-08-15: qty 2

## 2011-08-15 MED ORDER — GABAPENTIN 600 MG PO TABS: 600.0000 mg | ORAL_TABLET | Freq: Two times a day (BID) | ORAL | Status: DC

## 2011-08-15 MED ORDER — ZOLPIDEM TARTRATE 5 MG PO TABS
5.0000 mg | ORAL_TABLET | Freq: Once | ORAL | Status: AC
  Administered 2011-08-15: 5 mg via ORAL
  Filled 2011-08-15: qty 1

## 2011-08-15 MED ORDER — PANTOPRAZOLE SODIUM 40 MG PO TBEC
40.0000 mg | DELAYED_RELEASE_TABLET | Freq: Two times a day (BID) | ORAL | Status: DC
  Administered 2011-08-15 – 2011-08-17 (×4): 40 mg via ORAL
  Filled 2011-08-15 (×7): qty 1

## 2011-08-15 MED ORDER — DIPHENHYDRAMINE HCL 50 MG/ML IJ SOLN
INTRAMUSCULAR | Status: DC | PRN
  Administered 2011-08-15: 25 mg via INTRAVENOUS

## 2011-08-15 MED ORDER — GABAPENTIN 300 MG PO CAPS
600.0000 mg | ORAL_CAPSULE | Freq: Two times a day (BID) | ORAL | Status: DC
  Administered 2011-08-15 – 2011-08-17 (×4): 600 mg via ORAL
  Filled 2011-08-15 (×5): qty 2

## 2011-08-15 MED ORDER — DIPHENHYDRAMINE HCL 50 MG/ML IJ SOLN
INTRAMUSCULAR | Status: AC
  Filled 2011-08-15: qty 1

## 2011-08-15 NOTE — H&P (View-Only) (Signed)
Patient ID: Paige Black, female   DOB: 1945-11-17, 66 y.o.   MRN: [id number]  Paige Black is a 66 year old female, known to Dr. [name], who is seen as an urgent add-on today with complaints of nausea vomiting and black stools. She was hypotensive in the office with blood pressure 82/53 and pulse 119 and found to have black tarry stool on exam. She is admitted to [facility] with an acute upper GI bleed and for details please see the history and physical.

## 2011-08-15 NOTE — Interval H&P Note (Signed)
History and Physical Interval Note:  08/15/2011 10:55 AM  Paige Black  has presented today for surgery, with the diagnosis of melena, hematemesis  The various methods of treatment have been discussed with the patient and family. After consideration of risks, benefits and other options for treatment, the patient has consented to  Procedure(s) (LRB): ESOPHAGOGASTRODUODENOSCOPY (EGD) (N/A) as a surgical intervention .  The patients' history has been reviewed, patient examined, no change in status, stable for surgery.  I have reviewed the patients' chart and labs.  Questions were answered to the patient's satisfaction.     The recent H&P (dated *08/14/11**) was reviewed, the patient was examined and there is no change in the patients condition since that H&P was completed.   [name]  08/15/2011, 10:55 AM   [name]

## 2011-08-15 NOTE — Progress Notes (Signed)
Endoscopy demonstrates multiple duodenal ulcers and gastric erosions. She is not actively bleeding.  Recommendations #1 continue high-dose PPI therapy #2 house diet

## 2011-08-15 NOTE — Op Note (Signed)
[facility] [address] [place], [place]  [address]  ENDOSCOPY PROCEDURE REPORT  PATIENT:  Paige Black, Paige Black  MR#:  [id number] BIRTHDATE:  [birth year], 65 yrs. old  GENDER:  female  ENDOSCOPIST:  Barbette Hair. Arlyce Dice, MD Referred by:  PROCEDURE DATE:  08/15/2011 PROCEDURE:  EGD with biopsy, [id number] ASA CLASS:  Class II INDICATIONS:  melena  MEDICATIONS:   These medications were titrated to patient response per physician's verbal order, Fentanyl 75 mcg IV, Versed 7.5 mg IV, Benadryl 25 mg IV, glycopyrrolate (Robinal) 0.2 mg IV TOPICAL ANESTHETIC:  Cetacaine Spray  DESCRIPTION OF PROCEDURE:   After the risks and benefits of the procedure were explained, informed consent was obtained.  The Pentax Gastroscope [id number] endoscope was introduced through the mouth and advanced to the third portion of the duodenum.  The instrument was slowly withdrawn as the mucosa was fully examined. <<PROCEDUREIMAGES>>  Multiple ulcers were found in the bulb of the duodenum. Multiple superficial clean-based ulcers in the duodenal bulb measuring 2-4 mm. There is surrounding erythema. Biopsy was taken to rule out H. pylori (see image4, image5, and image6).  Multiple erosions were found in the antrum (see image7).  Esophagitis was found (see image11). Grade B erosive esophagitis at the GE junction Retroflexed views revealed no abnormalities.    The scope was then withdrawn from the patient and the procedure completed. COMPLICATIONS:  None  ENDOSCOPIC IMPRESSION: 1) Ulcers, multiple in the bulb of duodenum 2) Erosions, multiple in the antrum 3) Esophagitis RECOMMENDATIONS: 1) continue high dose PPI 2) Await biopsy results 3) avoid NSAIDS  ______________________________ Barbette Hair. Arlyce Dice, MD  CC:  Jacques Navy, MD, [name], MD  n. Rosalie Doctor:   Barbette Hair. Makaiya Geerdes at 08/15/2011 11:11 AM  Sharon Seller, [phone]

## 2011-08-16 ENCOUNTER — Encounter (HOSPITAL_COMMUNITY): Payer: Self-pay | Admitting: Gastroenterology

## 2011-08-16 ENCOUNTER — Encounter (HOSPITAL_COMMUNITY): Payer: Self-pay

## 2011-08-16 LAB — HEMOGLOBIN AND HEMATOCRIT, BLOOD: HCT: 25.7 % — ABNORMAL LOW (ref 36.0–46.0)

## 2011-08-16 MED ORDER — LORAZEPAM 0.5 MG PO TABS: 0.5000 mg | ORAL_TABLET | Freq: Two times a day (BID) | ORAL | Status: DC | PRN

## 2011-08-16 MED ORDER — ONDANSETRON HCL 4 MG/2ML IJ SOLN: 4.0000 mg | Freq: Four times a day (QID) | INTRAMUSCULAR | Status: DC | PRN

## 2011-08-16 MED ORDER — ACETAMINOPHEN 325 MG PO TABS: 650.0000 mg | ORAL_TABLET | ORAL | Status: DC | PRN

## 2011-08-16 MED ORDER — ZOLPIDEM TARTRATE 5 MG PO TABS
5.0000 mg | ORAL_TABLET | Freq: Every evening | ORAL | Status: DC | PRN
  Administered 2011-08-16: 5 mg via ORAL
  Filled 2011-08-16: qty 1

## 2011-08-16 NOTE — Progress Notes (Signed)
I have personally taken an interval history, reviewed the chart, and examined the patient.  I agree with the extender's note, impression and recommendations.  

## 2011-08-16 NOTE — Progress Notes (Signed)
Patient ID: Paige Black, female   DOB: Sep 03, 1945, 66 y.o.   MRN: [id number] [facility] Progress Note  Subjective: Pt feels better, no black stool since one small episode yesterday am. No n/v. She has had some indigestion.Feels weak. Daughter concerned as she has had no appetite for some time.  Objective:  Vital signs in last 24 hours: Temp:  [97.9 F (36.6 C)-98.8 F (37.1 C)] 98.8 F (37.1 C) (05/22 0520) Pulse Rate:  [68-77] 69  (05/22 0520) Resp:  [12-28] 18  (05/22 0520) BP: (100-137)/(64-87) 120/83 mmHg (05/22 0520) SpO2:  [96 %-100 %] 98 % (05/22 0520) Last BM Date: 08/15/11 General:   Alert,  Well-developed,    in NAD Heart:  Regular rate and rhythm; no murmurs Pulm;clear Abdomen:  Soft, nontender and nondistended. Normal bowel sounds,  Neurologic:  Alert and  oriented x4;  grossly normal neurologically. Psych:  Alert and cooperative. Normal mood and affect.  Intake/Output from previous day: 05/21 0701 - 05/22 0700 In: 600 [P.O.:600] Out: -  Intake/Output this shift:    Lab Results:  Basename 08/15/11 0408 08/14/11 1749  WBC 6.0 7.8  HGB 9.5* 10.6*  HCT 27.7* 29.9*  PLT 222 240   BMET  Basename 08/15/11 0408 08/14/11 1749  NA 135 137  K 3.7 3.7  CL 100 97  CO2 27 26  GLUCOSE 122* 110*  BUN 37* 49*  CREATININE 1.01 1.04  CALCIUM 8.7 9.3   LFT  Basename 08/14/11 1749  PROT 6.5  ALBUMIN 3.9  AST 29  ALT 34  ALKPHOS 66  BILITOT 0.5  BILIDIR --  IBILI --   PT/INR  Basename 08/14/11 1749  LABPROT 13.5  INR 1.01      Assessment / Plan: # 1 66 yo female with acute Gi bleed secondary to duodenal ulcers/clean based-likely ETOH/Nsaid induced.No active bleeding. Check H.pylori ab #2 anemia -secondary to acute blood loss-stable-will repeat hgb this am #3 etoh abuse hx-- no lab evidence for cirrhosis- no signs of withdrawal-EtOH level  0  On admit #4 Peripheral  Neuropathy-continue gabapentin.  Probably home in am  Active  Problems:  Duodenal ulcer hemorrhage     LOS: 2 days   Jini Horiuchi  08/16/2011, 9:24 AM

## 2011-08-17 LAB — CBC
MCHC: 35 g/dL (ref 30.0–36.0)
Platelets: 202 10*3/uL (ref 150–400)
RDW: 12.6 % (ref 11.5–15.5)
WBC: 5 10*3/uL (ref 4.0–10.5)

## 2011-08-17 LAB — H. PYLORI ANTIBODY, IGG: H Pylori IgG: <0.4 {ISR}

## 2011-08-17 LAB — HEMOGLOBIN AND HEMATOCRIT, BLOOD: HCT: 26.8 % — ABNORMAL LOW (ref 36.0–46.0)

## 2011-08-17 MED ORDER — PANTOPRAZOLE SODIUM 40 MG PO TBEC: DELAYED_RELEASE_TABLET | ORAL | Status: DC

## 2011-08-17 NOTE — Progress Notes (Signed)
[facility] Progress Note  SUBJECTIVE: feels okay, no melena  OBJECTIVE:  Vital signs in last 24 hours: Temp:  [98.1 F (36.7 C)-98.4 F (36.9 C)] 98.1 F (36.7 C) (05/23 0603) Pulse Rate:  [60-82] 60  (05/23 0603) Resp:  [17-18] 18  (05/23 0603) BP: (136-163)/(76-84) 136/81 mmHg (05/23 0603) SpO2:  [97 %-100 %] 97 % (05/23 0603) Last BM Date: 08/16/11 General:    white female in NAD Heart:  Regular rate and rhythm Abdomen:  Soft, nontender and nondistended. Normal bowel sounds. Extremities:  Without edema. Neurologic:  Alert and oriented,  grossly normal neurologically. Psych:  Cooperative. Normal mood and affect.     Lab Results:  Basename 08/17/11 0353 08/16/11 2150 08/15/11 0408 08/14/11 1749  WBC 5.0 -- 6.0 7.8  HGB 9.2* 9.1* 9.5* --  HCT 26.3* 25.7* 27.7* --  PLT 202 -- 222 240    ASSESSMENT / PLAN:  1. Upper GI bleed secondary to multiple duodenal ulcers. Path c/w mild inflammation. H pylori serology pending. She had been taking some NSAIDS. Patient stable for discharge today. Continue PPI x 2 weeks then down to daily for one month . Avoid NSAIDS. Follow up appt made with Dr. Juanda Chance.  2. Anemia of acute blood loss secondary to #1. Hemoglobin stabilized, no bloody transfusion necessary. Hemoglobin nadir 9.1.     LOS: 3 days   Willette Cluster  08/17/2011, 9:10 AM

## 2011-08-17 NOTE — Progress Notes (Signed)
I have personally taken an interval history, reviewed the chart, and examined the patient.  I agree with the extender's note, impression and recommendations.  

## 2011-08-17 NOTE — Discharge Summary (Signed)
[facility] Discharge Summary  Name: Paige Black MRN: [id number] DOB: 1945-05-05 66 y.o. PCP:  Paige Regulus, MD, MD  Date of Admission: 08/14/2011  4:26 PM Date of Discharge: 08/17/2011 Attending Physician: Paige Meckel, MD Primary GI:  Paige Sar, MD  Discharge Diagnosis: 1. Upper GI bleed secondary to duodenal ulcers 2. Anemia of acute blood loss, stable 3. History of depression and anxiety, No major issues this admission 4. GERD with Class B esophagitis on EGD.  5. Peripheral neuropathy, stable.  4. History of ETOH abuse, no signs of withdrawal this admission  Consultations:  none  GI Procedures: EGD Paige Black)  History/Physical Exam:  See Admission H&P  Admission HPI:  Done by Paige Gip, PA-C Paige Black is a 66 y.o. female known to Dr. Lina Black who has not been seen by GI for several years. She is a primary patient of Dr. Debby Black. She was last seen by Dr. Debby Black in February. At that time she had denied any EtOH use but had an EtOH level of 278. She does have previous history of EtOH abuse and has been through EtOH rehabilitation. Recently she's been having difficulty with neuropathy in both of her lower extremities. She also has history of spinal stenosis ,anxiety and depression. She has had remote colonoscopy and endoscopy in 1998 and 1999 which were negative with the exception of some Candida esophagitis. She says she's been taking about 4 Advil per day over the past week because of pain in her legs and also had an increase in her Neurontin. She denies any other NSAID or aspirin use. She does admit to some alcohol use but says she has not been drinking in excess,and only wine. She comes in today with onset of vomiting yesterday at about 3 PM. She says she's had multiple episodes of emesis and has brought up some dark brown and black material but no bright red blood she says she was unable to sleep all night last night due to vomiting. About 3:30 AM had a  partially brown bowel movement followed by dark black stool and then had a second episode about 9:30 this morning with a large black bowel movement. She has not had any bowel movement since. She denies any abdominal pain continues to feel very nauseated. She's also been lightheaded and dizziness with standing today. She denies any chest pain or shortness of breath. Blood pressure in the office today is 82/53 pulse 120   Hospital Course by problem list: 1. Upper GI bleed - Patient admitted from our office after presenting with melena, hypotension and tachycardia. Admitting labs revealed hemoglobin of10.6, down from 13.8 in Feb 2013. Patient underwent EGD by Dr. Arlyce Black. Findings including esophagitis and multiple clean based duodenal ulcers. Duodenal biopsies were c/w mild inflammation only, patient had been taking NSAIDS at home. H.Pylori IgG was negative. Patient had no further bleeding once admitted to hospital and was stable for discharge by the 3rd day of admission. Her Pepcid was changed to Protonix which she would take twice daily for 2 weeks then decrease to once daily for four weeks. Advised to discontinue NSIADS. Patient was given a follow up appointment with her primary GI, Dr. Juanda Black.   2. Anemia of acute blood loss.  Hemoglobin down to 10.6  from 13.8 in Feb 2013. Nadir hemoglobin 9.1 this admission. She did not require a blood transfusion.   3. History of depression and anxiety. Zoloft continued inpatient. No major issues this admission  4. Peripheral neuropathy. Gabapentin was  continued inpatient.   5. History of ETOH abuse. No signs of withdrawal this admission  6  GERD/ Class B esophagitis on EGD. Her home Pepcid was changed to Protonix.   Discharge Vitals:  BP 136/81  Pulse 60  Temp(Src) 98.1 F (36.7 C) (Oral)  Resp 18  Ht 5\' 5"  (1.651 m)  Wt 139 lb (63.05 kg)  BMI 23.13 kg/m2  SpO2 97%  Discharge Labs:  Results for orders placed during the hospital encounter of 08/14/11  (from the past 24 hour(s))  HEMOGLOBIN AND HEMATOCRIT, BLOOD     Status: Abnormal   Collection Time   08/16/11  9:50 PM      Component Value Range   Hemoglobin 9.1 (*) 12.0 - 15.0 (g/dL)   HCT 16.1 (*) 09.6 - 46.0 (%)  CBC     Status: Abnormal   Collection Time   08/17/11  3:53 AM      Component Value Range   WBC 5.0  4.0 - 10.5 (K/uL)   RBC 2.64 (*) 3.87 - 5.11 (MIL/uL)   Hemoglobin 9.2 (*) 12.0 - 15.0 (g/dL)   HCT 04.5 (*) 40.9 - 46.0 (%)   MCV 99.6  78.0 - 100.0 (fL)   MCH 34.8 (*) 26.0 - 34.0 (pg)   MCHC 35.0  30.0 - 36.0 (g/dL)   RDW 81.1  91.4 - 78.2 (%)   Platelets 202  150 - 400 (K/uL)  HEMOGLOBIN AND HEMATOCRIT, BLOOD     Status: Abnormal   Collection Time   08/17/11  9:35 AM      Component Value Range   Hemoglobin 9.5 (*) 12.0 - 15.0 (g/dL)   HCT 95.6 (*) 21.3 - 46.0 (%)    Disposition and follow-up:   [name] was discharged from [facility] in stable condition.    Follow-up Appointments: Discharge Orders    Future Appointments: Provider: Department: Dept Phone: Center:   09/15/2011 3:45 PM Paige Carwin, MD [facility] Office [phone] [facility]     Future Orders Please Complete By Expires   Diet general      Increase activity slowly         Discharge Medications: Medication List  As of 08/17/2011 12:24 PM   STOP taking these medications         PEPCID AC PO         TAKE these medications         ANALGESIC CREAM/ALOE 10 % cream   Generic drug: trolamine salicylate   Apply 1 application topically as needed. To right leg.      gabapentin 300 MG capsule   Commonly known as: NEURONTIN   Take 600 mg by mouth 2 (two) times daily.      LORazepam 0.5 MG tablet   Commonly known as: ATIVAN   Take 1 mg by mouth daily as needed. Panic attacks.      losartan 100 MG tablet   Commonly known as: COZAAR   TAKE ONE TABLET EVERY DAY      mulitivitamin with minerals Tabs   Take 1 tablet by mouth daily.      pantoprazole 40 MG tablet    Commonly known as: PROTONIX   Take one tablet twice daily 30 minutes before meals for 14 days, then once daily 30 minutes before breakfast.      POTASSIUM CHLORIDE PO   Take 1 tablet by mouth daily.      sertraline 100 MG tablet   Commonly known as: ZOLOFT  Take 2 tablets (200 mg total) by mouth daily.            Signed: Willette Cluster 08/17/2011, 12:02 PM

## 2011-08-23 ENCOUNTER — Encounter: Payer: Self-pay | Admitting: Gastroenterology

## 2011-08-28 ENCOUNTER — Encounter: Payer: Self-pay | Admitting: *Deleted

## 2011-09-07 ENCOUNTER — Other Ambulatory Visit: Payer: Self-pay | Admitting: Internal Medicine

## 2011-09-08 MED ORDER — PANTOPRAZOLE SODIUM 40 MG PO TBEC: 40.0000 mg | DELAYED_RELEASE_TABLET | Freq: Every day | ORAL | Status: DC

## 2011-09-08 NOTE — Telephone Encounter (Signed)
Sent rx for patient

## 2011-09-15 ENCOUNTER — Other Ambulatory Visit (INDEPENDENT_AMBULATORY_CARE_PROVIDER_SITE_OTHER): Payer: Medicare Other

## 2011-09-15 ENCOUNTER — Ambulatory Visit (INDEPENDENT_AMBULATORY_CARE_PROVIDER_SITE_OTHER): Payer: Medicare Other | Admitting: Internal Medicine

## 2011-09-15 ENCOUNTER — Encounter: Payer: Self-pay | Admitting: Internal Medicine

## 2011-09-15 VITALS — BP 118/64 | HR 76 | Ht 65.0 in | Wt 148.0 lb

## 2011-09-15 DIAGNOSIS — R195 Other fecal abnormalities: Secondary | ICD-10-CM

## 2011-09-15 DIAGNOSIS — K264 Chronic or unspecified duodenal ulcer with hemorrhage: Secondary | ICD-10-CM

## 2011-09-15 LAB — CBC WITH DIFFERENTIAL/PLATELET
Basophils Relative: 0.8 % (ref 0.0–3.0)
Eosinophils Relative: 0.9 % (ref 0.0–5.0)
HCT: 37.2 % (ref 36.0–46.0)
Hemoglobin: 12.5 g/dL (ref 12.0–15.0)
Lymphocytes Relative: 27 % (ref 12.0–46.0)
Lymphs Abs: 1.7 10*3/uL (ref 0.7–4.0)
Monocytes Relative: 8 % (ref 3.0–12.0)
Neutro Abs: 3.9 10*3/uL (ref 1.4–7.7)
RBC: 3.58 Mil/uL — ABNORMAL LOW (ref 3.87–5.11)

## 2011-09-15 MED ORDER — LORAZEPAM 0.5 MG PO TABS: ORAL_TABLET | ORAL | Status: DC

## 2011-09-15 MED ORDER — TRAMADOL HCL 50 MG PO TABS: ORAL_TABLET | ORAL | Status: DC

## 2011-09-15 MED ORDER — MOVIPREP 100 G PO SOLR: ORAL | Status: DC

## 2011-09-15 MED ORDER — PANTOPRAZOLE SODIUM 40 MG PO TBEC: DELAYED_RELEASE_TABLET | ORAL | Status: DC

## 2011-09-15 NOTE — Progress Notes (Signed)
Paige Black 12-15-1945 MRN [id number]        History of Present Illness:  This is a 66 year old white female post hospitalization for upper GI bleed due to multiple duodenal ulcers and gastritis. The etiology was likely combination of anti-inflammatory agents and possibly alcohol , although her liver function tests were normal. She was discharged approximately 4 weeks ago and has done well. There has been no melena, dysphagia, abdominal pain. She has been on Protonix 40 mg once a day in the mornings but has been having occasional reflux at night . On upper endoscopy on 08/15/2011 she had multiple duodenal bulb ulcerations, antral erosions and esophagitis. Biopsies were negative for H. pylori. Her lowest hemoglobin during hospitalization was 9.1 she did not require blood transfusion. She had a screening colonoscopy in 1999 which was normal. She had upper endoscopy in 1999 which showed Candida esophagitis. She has a chronic right leg pain he due to radiculopathy which this has been an ongoing problem causing her depression and limited mobility. She has trouble sleeping and has been taking a gabapentin and Advil for the leg pain   Past Medical History  Diagnosis Date  . Anxiety   . Depression   . Hypertension   . H/O: alcohol abuse 2008    28 days at [facility]  . Hepatitis, alcoholic   . History of acute pyelonephritis   . Cystitis, chronic   . Spinal stenosis, lumbar     congenital anomalous L9 vertebra  . Radiculopathy of sacral and sacrococcygeal region     S1-5 with weakness and paresthesia right foot  . Uterine fibroid   . Candidiasis of the esophagus   . Duodenal ulcer 08/15/11    multiple  . Anemia associated with acute blood loss   . Peripheral neuropathy    Past Surgical History  Procedure Date  . Appendectomy   . Tonsillectomy   . Salpingectomy     LT - ectopic pregnancy  . Lumbar fusion 05/2007    L5-6 L6-S1  . Myomectomy 09/2007    Uterine Fibroids  . Renal  artery stent placement   . Esophagogastroduodenoscopy 08/15/2011    Procedure: ESOPHAGOGASTRODUODENOSCOPY (EGD);  Surgeon: Louis Meckel, MD;  Location: [facility];  Service: Endoscopy;  Laterality: N/A;    reports that she has quit smoking. She has never used smokeless tobacco. She reports that she drinks alcohol. She reports that she does not use illicit drugs. family history includes Aortic aneurysm in her father; Breast cancer in her paternal aunt; Dementia in her father; Diabetes in her father and mothers; Heart disease in her father; Hypertension in her father and mother; and Irritable bowel syndrome in her sisters.  There is no history of Colon cancer. Allergies  Allergen Reactions  . Penicillins Other (See Comments)    Rash--childhood reaction.  . Sulfonamide Derivatives Nausea And Vomiting    REACTION: sensitivity        Review of Systems: No abdominal reflux. Weight has increased about 8 pounds  The remainder of the 10 point ROS is negative except as outlined in H&P   Physical Exam: General appearance  Well developed, in no distress. Eyes- non icteric. HEENT nontraumatic, normocephalic. Mouth no lesions, tongue papillated, no cheilosis. Neck supple without adenopathy, thyroid not enlarged, no carotid bruits, no JVD. Lungs Clear to auscultation bilaterally. Cor normal S1, normal S2, regular rhythm, no murmur,  quiet precordium. Abdomen: Mildly protuberant but soft, nontender. No ascites. Liver edge at costal margin Rectal: Soft Hemoccult  positive stool Extremities no pedal edema. Multiple abrasions on her legs Skin no lesions. Neurological alert and oriented x 3. No asterixis Psychological normal mood and affect.  Assessment and Plan:  Status post a recent upper GI bleed due to duodenal ulcers caused by nonsteroidal agents and possibly alcohol. She is doing well but remains Hemoccult positive. Her reflux has been only partially controlled. So we will increase her  Protonix to 40 mg twice a day. 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 She was taking excess Advil. We will switch to tramadol 50 mg,# 60 tablets per months. We will refill her Ativan.5 mg for sleep and anxiety  History of abnormal liver function tests. Last set of LFT's was normal. We will keep eye on her liver. She does not have any stigmata of cirrhosis.  Alcoholism- last set of LFT's was normal, I think she has been drinking much less.   09/15/2011 Paige Black

## 2011-09-15 NOTE — Patient Instructions (Addendum)
You have been scheduled for a colonoscopy with propofol. Please follow written instructions given to you at your visit today.  Please pick up your prep kit at the pharmacy within the next 1-3 days. We have sent the following medications to your pharmacy: Tramadol Ativan Protonix (twice daily) Your physician has requested that you go to the basement for the following lab work before leaving today: CBC We have given you samples of Analpram CC: Dr Illene Regulus

## 2011-09-29 ENCOUNTER — Other Ambulatory Visit: Payer: Self-pay | Admitting: *Deleted

## 2011-09-29 DIAGNOSIS — M79609 Pain in unspecified limb: Secondary | ICD-10-CM

## 2011-10-03 ENCOUNTER — Encounter (INDEPENDENT_AMBULATORY_CARE_PROVIDER_SITE_OTHER): Payer: Medicare Other

## 2011-10-03 DIAGNOSIS — M79609 Pain in unspecified limb: Secondary | ICD-10-CM

## 2011-10-03 DIAGNOSIS — M7989 Other specified soft tissue disorders: Secondary | ICD-10-CM

## 2011-10-10 ENCOUNTER — Encounter (INDEPENDENT_AMBULATORY_CARE_PROVIDER_SITE_OTHER): Payer: Medicare Other

## 2011-10-10 ENCOUNTER — Other Ambulatory Visit: Payer: Self-pay | Admitting: Cardiology

## 2011-10-10 DIAGNOSIS — R52 Pain, unspecified: Secondary | ICD-10-CM

## 2011-10-10 DIAGNOSIS — I739 Peripheral vascular disease, unspecified: Secondary | ICD-10-CM

## 2011-10-27 ENCOUNTER — Encounter: Payer: Self-pay | Admitting: Internal Medicine

## 2011-10-27 ENCOUNTER — Ambulatory Visit (AMBULATORY_SURGERY_CENTER): Payer: Medicare Other | Admitting: Internal Medicine

## 2011-10-27 VITALS — BP 159/113 | HR 78 | Temp 98.4°F | Resp 29 | Ht 65.0 in | Wt 148.0 lb

## 2011-10-27 DIAGNOSIS — R195 Other fecal abnormalities: Secondary | ICD-10-CM

## 2011-10-27 DIAGNOSIS — K264 Chronic or unspecified duodenal ulcer with hemorrhage: Secondary | ICD-10-CM

## 2011-10-27 DIAGNOSIS — D126 Benign neoplasm of colon, unspecified: Secondary | ICD-10-CM

## 2011-10-27 MED ORDER — SODIUM CHLORIDE 0.9 % IV SOLN: 500.0000 mL | INTRAVENOUS | Status: DC

## 2011-10-27 NOTE — Progress Notes (Signed)
Patient did not experience any of the following events: a burn prior to discharge; a fall within the facility; wrong site/side/patient/procedure/implant event; or a hospital transfer or hospital admission upon discharge from the facility. (G8907) Patient did not have preoperative order for IV antibiotic SSI prophylaxis. (G8918)  

## 2011-10-27 NOTE — Patient Instructions (Addendum)

## 2011-10-27 NOTE — Op Note (Signed)
[facility] [address] [place], [place]  [address]  COLONOSCOPY PROCEDURE REPORT  PATIENT:  Paige Black, Paige Black  MR#:  [id number] BIRTHDATE:  [birth year], 65 yrs. old  GENDER:  female ENDOSCOPIST:  Hedwig Morton. Juanda Chance, MD REF. BY: PROCEDURE DATE:  10/27/2011 PROCEDURE:  Colonoscopy with hot biopsy and snare polypectomy, Colonoscopy with snare polypectomy ASA CLASS:  Class II INDICATIONS:  heme positive stool, colorectal cancer screening, average risk last colon 1999 was normal MEDICATIONS:   MAC sedation, administered by CRNA, propofol (Diprivan) 550 mg  DESCRIPTION OF PROCEDURE:   After the risks and benefits and of the procedure were explained, informed consent was obtained. Digital rectal exam was performed and revealed no rectal masses. The LB [id number] endoscope was introduced through the anus and advanced to the cecum, which was identified by both the appendix and ileocecal valve.  The quality of the prep was excellent, using MoviPrep.  The instrument was then slowly withdrawn as the colon was fully examined. <<PROCEDUREIMAGES>>  FINDINGS:  There were multiple polyps identified and removed. 6 polyps left and right colon measuring 3-10 mm, all sessile one 15 mm sessile polyp in the cecum,removed in 2 pieces With hot biopsy forceps, biopsy was obtained and sent to pathology. Polyps were snared, then cauterized with monopolar cautery. Retrieval was successful (see image1, image2, image3, image4, image5, image6, image9, and image10). snare polyp  This was otherwise a normal examination of the colon (see image11, image8, and image7). Retroflexed views in the rectum revealed no abnormalities.    The scope was then withdrawn from the patient and the procedure completed.  COMPLICATIONS:  None ENDOSCOPIC IMPRESSION: 1) Polyps, multiple 2) Otherwise normal examination multiple snare polypectomies cold and hot snare RECOMMENDATIONS: 1) Await pathology results 2)  High fiber diet. no ASA or NSAIDS x 2 weeks  REPEAT EXAM:  In 5 year(s) for.  ______________________________ Hedwig Morton. Juanda Chance, MD  CC:  Jacques Navy, MD  n. Rosalie DoctorHedwig Morton. Waldon Sheerin at 10/27/2011 12:22 PM  Alcock, [name], [phone]

## 2011-10-30 ENCOUNTER — Telehealth: Payer: Self-pay | Admitting: *Deleted

## 2011-10-30 NOTE — Telephone Encounter (Signed)
  Follow up Call-  Call back number 10/27/2011  Post procedure Call Back phone  # [phone]  Permission to leave phone message Yes     Patient questions:  Do you have a fever, pain , or abdominal swelling? no Pain Score  0 *  Have you tolerated food without any problems? yes  Have you been able to return to your normal activities? yes  Do you have any questions about your discharge instructions: Diet   no Medications  no Follow up visit  no  Do you have questions or concerns about your Care? no  Actions: * If pain score is 4 or above: No action needed, pain <4.

## 2011-10-31 ENCOUNTER — Encounter: Payer: Self-pay | Admitting: Internal Medicine

## 2011-12-05 ENCOUNTER — Encounter: Payer: Self-pay | Admitting: Gynecology

## 2011-12-05 DIAGNOSIS — N3020 Other chronic cystitis without hematuria: Secondary | ICD-10-CM | POA: Insufficient documentation

## 2011-12-05 DIAGNOSIS — M5418 Radiculopathy, sacral and sacrococcygeal region: Secondary | ICD-10-CM | POA: Insufficient documentation

## 2011-12-05 DIAGNOSIS — M48061 Spinal stenosis, lumbar region without neurogenic claudication: Secondary | ICD-10-CM | POA: Insufficient documentation

## 2011-12-05 DIAGNOSIS — F329 Major depressive disorder, single episode, unspecified: Secondary | ICD-10-CM | POA: Insufficient documentation

## 2011-12-05 DIAGNOSIS — H269 Unspecified cataract: Secondary | ICD-10-CM | POA: Insufficient documentation

## 2011-12-05 DIAGNOSIS — Z87440 Personal history of urinary (tract) infections: Secondary | ICD-10-CM | POA: Insufficient documentation

## 2011-12-05 DIAGNOSIS — I10 Essential (primary) hypertension: Secondary | ICD-10-CM | POA: Insufficient documentation

## 2011-12-05 DIAGNOSIS — F419 Anxiety disorder, unspecified: Secondary | ICD-10-CM | POA: Insufficient documentation

## 2011-12-05 DIAGNOSIS — D259 Leiomyoma of uterus, unspecified: Secondary | ICD-10-CM | POA: Insufficient documentation

## 2011-12-05 DIAGNOSIS — Z87448 Personal history of other diseases of urinary system: Secondary | ICD-10-CM | POA: Insufficient documentation

## 2011-12-05 DIAGNOSIS — F1011 Alcohol abuse, in remission: Secondary | ICD-10-CM | POA: Insufficient documentation

## 2011-12-05 DIAGNOSIS — F32A Depression, unspecified: Secondary | ICD-10-CM | POA: Insufficient documentation

## 2011-12-05 DIAGNOSIS — N189 Chronic kidney disease, unspecified: Secondary | ICD-10-CM | POA: Insufficient documentation

## 2011-12-05 DIAGNOSIS — K7010 Alcoholic hepatitis without ascites: Secondary | ICD-10-CM | POA: Insufficient documentation

## 2011-12-05 DIAGNOSIS — O009 Unspecified ectopic pregnancy without intrauterine pregnancy: Secondary | ICD-10-CM | POA: Insufficient documentation

## 2011-12-05 DIAGNOSIS — G629 Polyneuropathy, unspecified: Secondary | ICD-10-CM | POA: Insufficient documentation

## 2011-12-11 ENCOUNTER — Telehealth: Payer: Self-pay | Admitting: Internal Medicine

## 2011-12-11 ENCOUNTER — Encounter: Payer: Self-pay | Admitting: Internal Medicine

## 2011-12-11 ENCOUNTER — Other Ambulatory Visit: Payer: Self-pay | Admitting: *Deleted

## 2011-12-11 DIAGNOSIS — R251 Tremor, unspecified: Secondary | ICD-10-CM

## 2011-12-11 MED ORDER — LORAZEPAM 0.5 MG PO TABS: ORAL_TABLET | ORAL | Status: DC

## 2011-12-11 NOTE — Telephone Encounter (Signed)
error 

## 2011-12-11 NOTE — Telephone Encounter (Signed)
OK , please refer. I don't know who is the new Neurologist after Dr Modesto Charon but it is OK for her to see him/her. DB

## 2011-12-11 NOTE — Telephone Encounter (Signed)
Rx sent 

## 2011-12-11 NOTE — Telephone Encounter (Signed)
Patient has heard about new [facility] MD and she would like to see her but she needs a referral. Wants to know if Dr. Juanda Chance will refer her for tremors. States she has seen Dr. Sandria Manly and another MD in their group in the past but she does not want to go back there. Please, advise.

## 2011-12-12 NOTE — Telephone Encounter (Signed)
Referral made to Dr. Arbutus Leas  In [facility] Misty Stanley a Neurology notified and she received the referral.

## 2011-12-14 ENCOUNTER — Encounter: Payer: Self-pay | Admitting: Obstetrics and Gynecology

## 2011-12-14 ENCOUNTER — Ambulatory Visit (INDEPENDENT_AMBULATORY_CARE_PROVIDER_SITE_OTHER): Payer: Medicare Other | Admitting: Obstetrics and Gynecology

## 2011-12-14 VITALS — BP 134/80 | Ht 65.0 in | Wt 140.0 lb

## 2011-12-14 DIAGNOSIS — R351 Nocturia: Secondary | ICD-10-CM

## 2011-12-14 DIAGNOSIS — N952 Postmenopausal atrophic vaginitis: Secondary | ICD-10-CM

## 2011-12-14 DIAGNOSIS — Z780 Asymptomatic menopausal state: Secondary | ICD-10-CM

## 2011-12-14 DIAGNOSIS — Z23 Encounter for immunization: Secondary | ICD-10-CM

## 2011-12-14 DIAGNOSIS — N393 Stress incontinence (female) (male): Secondary | ICD-10-CM

## 2011-12-14 DIAGNOSIS — R3915 Urgency of urination: Secondary | ICD-10-CM

## 2011-12-14 LAB — URINALYSIS W MICROSCOPIC + REFLEX CULTURE
Bilirubin Urine: NEGATIVE
Glucose, UA: NEGATIVE mg/dL
Hgb urine dipstick: NEGATIVE
Ketones, ur: NEGATIVE mg/dL
Nitrite: NEGATIVE
Specific Gravity, Urine: <1.005 — ABNORMAL LOW (ref 1.005–1.030)
pH: 5.5 (ref 5.0–8.0)

## 2011-12-14 NOTE — Patient Instructions (Signed)
Continue yearly mammograms. Scheduled bone density.

## 2011-12-14 NOTE — Progress Notes (Signed)
Patient came to see me today for further follow up. She is having nocturia. She has a history of urinary tract infections. She also has urgency of urination with some incontinence as well as occasional incontinence with stress. She is now having dysuria or hematuria. She does have atrophic vaginitis but is not sexually active and does not feel she needs treatment. We have previously discussed medication for detrussor instability and she continues to feel that she can do well without it. She is having minimal hot flashes. She is due for her yearly mammogram. She had one normal bone density in 2006. Do to her lumbar radiculopathy and paresthesias she has frequent falls but is not broken anything. She has always had normal Pap smears. Her last Pap smear was 2012. Her daughter had early onset breast cancer but is BRCA1 and BRCA2 negative. She has a paternal aunt who had early breast cancer but she isn't sure if she was less than 25 years old at diagnosis. She is no longer alive. She is having no vaginal bleeding. She is having no pelvic pain.  ROS: 12 system review done. Pertinent positives above. Other positives include esophageal candidia. Duodenal ulcers, depression,irritable bowel syndrome,PVCs, history of pyelonephritis,peripheral neuropathy, previous renal failure now better, and hepatitis.  Physical examination:Kari Aundria Rud present. HEENT within normal limits. Neck: Thyroid not large. No masses. Supraclavicular nodes: not enlarged. Breasts: Examined in both sitting and lying  position. No skin changes and no masses. Abdomen: Soft no guarding rebound or masses or hernia. Pelvic: External: Within normal limits. BUS: Within normal limits. Vaginal:within normal limits. Poor estrogen effect. No evidence of cystocele rectocele or enterocele. Cervix: clean. Uterus: Normal size and shape. Adnexa: No masses. Rectovaginal exam: Confirmatory and negative. Extremities: Within normal limits.  Assessment: #1.  Atrophic vaginitis #2. Urgency incontinence #3. Nocturia #4. Urinary stress incontinence  Plan: Urinalysis checked. Assuming  she does not have a urinary tract infection observation of other urinary symptoms. She will go to the cancer center to get counseling regarding BRCA1 and BRCA2 testing. Continue yearly mammograms. Bone density.The new Pap smear guidelines were discussed with the patient. No pap done.

## 2011-12-20 ENCOUNTER — Ambulatory Visit: Payer: Medicare Other | Admitting: Neurology

## 2012-01-02 ENCOUNTER — Ambulatory Visit (INDEPENDENT_AMBULATORY_CARE_PROVIDER_SITE_OTHER): Payer: Medicare Other

## 2012-01-02 DIAGNOSIS — M899 Disorder of bone, unspecified: Secondary | ICD-10-CM

## 2012-01-02 DIAGNOSIS — Z780 Asymptomatic menopausal state: Secondary | ICD-10-CM

## 2012-01-02 DIAGNOSIS — M8580 Other specified disorders of bone density and structure, unspecified site: Secondary | ICD-10-CM

## 2012-01-12 ENCOUNTER — Other Ambulatory Visit: Payer: Self-pay | Admitting: Internal Medicine

## 2012-01-26 ENCOUNTER — Encounter: Payer: Self-pay | Admitting: *Deleted

## 2012-01-29 ENCOUNTER — Telehealth: Payer: Self-pay | Admitting: *Deleted

## 2012-01-29 NOTE — Telephone Encounter (Signed)
Pt  informed to make OV to discuss dexa report.

## 2012-02-19 ENCOUNTER — Ambulatory Visit (INDEPENDENT_AMBULATORY_CARE_PROVIDER_SITE_OTHER): Payer: Medicare Other | Admitting: Obstetrics and Gynecology

## 2012-02-19 DIAGNOSIS — M899 Disorder of bone, unspecified: Secondary | ICD-10-CM

## 2012-02-19 DIAGNOSIS — M8580 Other specified disorders of bone density and structure, unspecified site: Secondary | ICD-10-CM

## 2012-02-19 NOTE — Patient Instructions (Signed)
We will call you with lab results. Restart calcium and vitamin D. Increase exercise. Reduce alcohol. Check costs of Fosamax and Actonel.

## 2012-02-19 NOTE — Progress Notes (Addendum)
I asked  patient come in today to discuss her most recent bone density. She continues to have low bone mass. She has had statistically significant loss of bone in both her spine and  her hip since her last bone density. She now has an elevated fracture risk of 19%/3.4%. She has had an orthopedic procedure for a disc. Because her vertebrae had never fused normally she had significant nerve damage that the neurosurgeon could not fix. As a result of this she is prone to fall and this happens at least weekly. So far she has not had a significant fracture. She is a nonsmoker. She has 1-2 alcoholic beverages a night. Previously she was drinking more. She is no longer exercising. She is not taking calcium and vitamin D regularly.  We discussed the above. This is very disconcerting to me especially that she is so non  stable on her feet. We discussed adequate calcium and vitamin D. We discussed reducing alcohol intake. We discussed increasing exercise. We drew  blood for secondary causes of  bone loss. We discussed Fosamax or Actonel. She will check with insurance. She was given explicit instructions on how to take it. She does have reflux but takes medication daily for it and will let me know if she has reflux on either of these drugs. An  Alternative  then would be Atelvia. Total time of interview was 30 minutes.

## 2012-02-20 ENCOUNTER — Telehealth: Payer: Self-pay | Admitting: Obstetrics and Gynecology

## 2012-02-20 NOTE — Telephone Encounter (Signed)
Message copied by Paige Black on Tue Feb 20, 2012  9:38 AM ------      Message from: Trellis Paganini      Created: Tue Feb 20, 2012  6:45 AM       Tell patient Vitamin D is too low. Start Vitamin D 2,000 IU daily . Needs followup D level in 4 months. Put recall in computer. Tell her I will get back to her when rest of lab back.

## 2012-02-20 NOTE — Telephone Encounter (Signed)
I do not remember the word but it certainly is due to neuropathy. The important issue is that the more times she falls the greater is her risk of fracture.

## 2012-02-20 NOTE — Telephone Encounter (Signed)
Patient was informed regarding Vit D Level results/recommendations.  She said at office visit yesterday she spoke with you about losing her balance at least once a week due to neuropathy.  She said you used a word in talking about that condition and she cannot remember what the word was.  She wanted to "google" it and cannot remember what you called it.  I looked in office visit note but did not see a word I thought would apply.

## 2012-02-21 LAB — PTH, INTACT AND CALCIUM
Calcium, Total (PTH): 9.7 mg/dL (ref 8.4–10.5)
PTH: 36.1 pg/mL (ref 14.0–72.0)

## 2012-02-21 NOTE — Telephone Encounter (Signed)
Patient advised.

## 2012-02-26 ENCOUNTER — Other Ambulatory Visit: Payer: Self-pay | Admitting: *Deleted

## 2012-02-26 MED ORDER — ALENDRONATE SODIUM 70 MG PO TABS: 70.0000 mg | ORAL_TABLET | ORAL | Status: DC

## 2012-02-26 NOTE — Telephone Encounter (Signed)
pt has decided on generic Fosomax. Rx sent to pharmacy [facility]

## 2012-02-28 ENCOUNTER — Other Ambulatory Visit: Payer: Self-pay | Admitting: Obstetrics and Gynecology

## 2012-02-28 ENCOUNTER — Other Ambulatory Visit: Payer: Self-pay | Admitting: Internal Medicine

## 2012-02-28 DIAGNOSIS — E559 Vitamin D deficiency, unspecified: Secondary | ICD-10-CM

## 2012-02-28 MED ORDER — GABAPENTIN 300 MG PO CAPS: 600.0000 mg | ORAL_CAPSULE | Freq: Two times a day (BID) | ORAL | Status: DC

## 2012-02-28 MED ORDER — LOSARTAN POTASSIUM 100 MG PO TABS: 100.0000 mg | ORAL_TABLET | Freq: Every day | ORAL | Status: DC

## 2012-02-28 MED ORDER — ALENDRONATE SODIUM 70 MG PO TABS: 70.0000 mg | ORAL_TABLET | ORAL | Status: DC

## 2012-02-28 MED ORDER — LORAZEPAM 0.5 MG PO TABS: ORAL_TABLET | ORAL | Status: DC

## 2012-02-28 MED ORDER — PANTOPRAZOLE SODIUM 40 MG PO TBEC: DELAYED_RELEASE_TABLET | ORAL | Status: DC

## 2012-02-28 MED ORDER — SERTRALINE HCL 100 MG PO TABS: 200.0000 mg | ORAL_TABLET | Freq: Every day | ORAL | Status: DC

## 2012-02-28 MED ORDER — TRAMADOL HCL 50 MG PO TABS: ORAL_TABLET | ORAL | Status: DC

## 2012-03-01 ENCOUNTER — Other Ambulatory Visit: Payer: Self-pay | Admitting: *Deleted

## 2012-03-01 MED ORDER — TRAMADOL HCL 50 MG PO TABS: ORAL_TABLET | ORAL | Status: DC

## 2012-03-11 ENCOUNTER — Telehealth: Payer: Self-pay | Admitting: Obstetrics and Gynecology

## 2012-03-11 NOTE — Telephone Encounter (Signed)
Patient advised of all this.  She said she only paid $39 for it and she thinks she will just let it go at that.  She does not want to pursue Rx.

## 2012-03-11 NOTE — Telephone Encounter (Signed)
Agree with what pat said. I might be willing to write it but don't know what she is talking about. If it is overthecounter medication I doubt medicare will pay.

## 2012-03-11 NOTE — Telephone Encounter (Signed)
Patient called me stating she had purchased a cane called "hurry cane" (I will verify spelling with her if needed) due to her balance issues. She said you have seen her at her worst with the balance thing.  She asked if it would be possible for you to write her a prescription for the cane and "if we could submit it to Medicare".  I had not heard this before so I talked with Jodell Cipro. PH said technically she should call the doctor who handles her neuropathy and back issues and have him write the script.  PH said since the patient has already paid for the cane that she will need to submit her receipt and the RX to her ins company.  I can tell patient this but wanted to check with you and see if that is okay with you or if indeed you wanted to write the RX for the cane for her.  (I am not sure how we will enter it as Rx order in Epic.)

## 2012-03-14 ENCOUNTER — Encounter: Payer: Self-pay | Admitting: Obstetrics and Gynecology

## 2012-03-28 ENCOUNTER — Telehealth: Payer: Self-pay | Admitting: *Deleted

## 2012-03-28 NOTE — Telephone Encounter (Signed)
Per Dr. Juanda Chance, call patient and schedule f/u OV.  Left a message for patient to call me.

## 2012-04-01 ENCOUNTER — Encounter: Payer: Self-pay | Admitting: *Deleted

## 2012-04-01 NOTE — Telephone Encounter (Signed)
Spoke with patient and scheduled OV on 04/12/12 at 1:15 PM.

## 2012-04-01 NOTE — Telephone Encounter (Signed)
Left a message for patient to call me to set up f/u OV.

## 2012-04-08 ENCOUNTER — Telehealth: Payer: Self-pay

## 2012-04-08 ENCOUNTER — Other Ambulatory Visit: Payer: Self-pay | Admitting: Gynecology

## 2012-04-08 DIAGNOSIS — N920 Excessive and frequent menstruation with regular cycle: Secondary | ICD-10-CM

## 2012-04-08 NOTE — Telephone Encounter (Signed)
Toniann Fail sent me a staff message saying patient wanted me to call her for more info regarding her bone density test result.  I left message home number for patient to call me.  Cell phone not working.

## 2012-04-09 ENCOUNTER — Telehealth: Payer: Self-pay

## 2012-04-09 NOTE — Telephone Encounter (Signed)
Patient called to ask about her T scores on Dexa scan.  Info provided.

## 2012-04-12 ENCOUNTER — Other Ambulatory Visit (INDEPENDENT_AMBULATORY_CARE_PROVIDER_SITE_OTHER): Payer: Medicare Other

## 2012-04-12 ENCOUNTER — Encounter: Payer: Self-pay | Admitting: Internal Medicine

## 2012-04-12 ENCOUNTER — Ambulatory Visit (INDEPENDENT_AMBULATORY_CARE_PROVIDER_SITE_OTHER): Payer: Medicare Other | Admitting: Internal Medicine

## 2012-04-12 VITALS — BP 132/74 | HR 76 | Ht 65.0 in | Wt 142.0 lb

## 2012-04-12 DIAGNOSIS — R270 Ataxia, unspecified: Secondary | ICD-10-CM

## 2012-04-12 DIAGNOSIS — R279 Unspecified lack of coordination: Secondary | ICD-10-CM

## 2012-04-12 DIAGNOSIS — R945 Abnormal results of liver function studies: Secondary | ICD-10-CM

## 2012-04-12 DIAGNOSIS — K700 Alcoholic fatty liver: Secondary | ICD-10-CM

## 2012-04-12 DIAGNOSIS — R7989 Other specified abnormal findings of blood chemistry: Secondary | ICD-10-CM

## 2012-04-12 DIAGNOSIS — R6889 Other general symptoms and signs: Secondary | ICD-10-CM

## 2012-04-12 DIAGNOSIS — R198 Other specified symptoms and signs involving the digestive system and abdomen: Secondary | ICD-10-CM

## 2012-04-12 LAB — CBC WITH DIFFERENTIAL/PLATELET
Basophils Relative: 0.5 % (ref 0.0–3.0)
Eosinophils Absolute: 0 10*3/uL (ref 0.0–0.7)
Eosinophils Relative: 0.5 % (ref 0.0–5.0)
Lymphocytes Relative: 26.6 % (ref 12.0–46.0)
Monocytes Absolute: 0.5 10*3/uL (ref 0.1–1.0)
Neutrophils Relative %: 64.9 % (ref 43.0–77.0)
Platelets: 228 10*3/uL (ref 150.0–400.0)
RBC: 4.19 Mil/uL (ref 3.87–5.11)
WBC: 6.7 10*3/uL (ref 4.5–10.5)

## 2012-04-12 LAB — HEPATIC FUNCTION PANEL
Albumin: 4.3 g/dL (ref 3.5–5.2)
Total Bilirubin: 0.6 mg/dL (ref 0.3–1.2)

## 2012-04-12 LAB — PROTIME-INR: Prothrombin Time: 10.3 s (ref 10.2–12.4)

## 2012-04-12 LAB — AMMONIA: Ammonia: 22 umol/L (ref 11–35)

## 2012-04-12 LAB — VITAMIN B12: Vitamin B-12: 581 pg/mL (ref 211–911)

## 2012-04-12 MED ORDER — THIAMINE HCL 100 MG PO TABS: 100.0000 mg | ORAL_TABLET | Freq: Every day | ORAL | Status: DC

## 2012-04-12 MED ORDER — FOLIC ACID 1 MG PO TABS: 1.0000 mg | ORAL_TABLET | Freq: Every day | ORAL | Status: DC

## 2012-04-12 MED ORDER — OMEPRAZOLE 20 MG PO CPDR: 20.0000 mg | DELAYED_RELEASE_CAPSULE | Freq: Every day | ORAL | Status: DC

## 2012-04-12 NOTE — Patient Instructions (Addendum)
You have been scheduled for an abdominal ultrasound at [facility] (1st floor of hospital) on Tuesday, 04/16/12 at 9:00 am. Please arrive 15 minutes prior to your appointment for registration. Make certain not to have anything to eat or drink 6 hours prior to your appointment. Should you need to reschedule your appointment, please contact radiology at [phone]. This test typically takes about 30 minutes to perform.  Your physician has requested that you go to the basement for the following lab work before leaving today: Hepatic Panel, Ammonia, PT, CBC, B12 Level  We have sent the following medications to your pharmacy for you to pick up at your convenience: Thiamine 100 mg-Take 1 tablet by mouth once daily x 7 days. Folic Acid 1 mg- Take 1 tablet by mouth once daily until all tablets have been taken. Prilosec 20 mg-Take 1 tablet daily as needed for heartburn/vomiting. Samples given.  CC: Dr Illene Regulus, Dr Danielle Dess

## 2012-04-12 NOTE — Progress Notes (Signed)
Paige Black Oct 01, 1945 MRN [id number]  History of Present Illness:  This is a 67 year old white female alcoholic, good friend of mine who has been followed for abnormal liver function tests as well as for gastroesophageal reflux. She had an upper GI bleed in May 2013 for which she was hospitalized and was found to have multiple duodenal ulcers. She also has history of pyelonephritis and severe radiculopathy due to spinal stenosis. She has permanent weakness in the right leg. Her daughter called me concerned about her mothers drinking, falling and being unstable with balance. Patient denies drinking excessively. She admits to having 1-2 glasses wine a day. She also complains of instability due to weakness in her right leg. Her last upper abdominal ultrasound in 2008 showed fatty liver and left renal atrophy. Her last colonoscopy in August 2013 and prior to that in 1999 showed multiple colon polyps consistent with hyperplastic polyp and tubular adenoma. She denies nausea or vomiting although her daughter reported to me that patient often gets sick on her stomach and vomits.   Past Medical History  Diagnosis Date  . H/O: alcohol abuse 2008    28 days at [facility]  . Hepatitis, alcoholic   . History of acute pyelonephritis   . Cystitis, chronic   . Radiculopathy of sacral and sacrococcygeal region     S1-5 with weakness and paresthesia right foot  . Candidiasis of the esophagus   . Duodenal ulcer 08/15/11    multiple  . Anemia associated with acute blood loss   . Peripheral neuropathy   . Cataract   . Chronic kidney disease     RECURRENT CYSTITIS  . Uterine fibroid   . Depression   . Anxiety   . Spinal stenosis, lumbar     congenital anomalous L9 vertebra  . Ectopic pregnancy   . Hypertension   . Tubular adenoma of colon 2013   Past Surgical History  Procedure Date  . Appendectomy   . Salpingectomy     LT - ectopic pregnancy  . Lumbar fusion 05/2007    L5-6 L6-S1  .  Myomectomy 09/2007    Uterine Fibroids  . Renal artery stent placement   . Esophagogastroduodenoscopy 08/15/2011    Procedure: ESOPHAGOGASTRODUODENOSCOPY (EGD);  Surgeon: Paige Meckel, MD;  Location: [facility];  Service: Endoscopy;  Laterality: N/A;  . Colonoscopy   . Oophorectomy     RSO  . Back surgery   . Tonsillectomy and adenoidectomy     reports that she has quit smoking. She has never used smokeless tobacco. She reports that she drinks about 3.6 ounces of alcohol per week. She reports that she does not use illicit drugs. family history includes Aortic aneurysm in her father; Breast cancer in her paternal aunt; Breast cancer (age of onset:25) in her daughter; Dementia in her father; Diabetes in her brother, father, mother, and sister; Heart disease in her father; Hypertension in her mother and sister; and Irritable bowel syndrome in her sister.  There is no history of Colon cancer. Allergies  Allergen Reactions  . Penicillins Other (See Comments)    Rash--childhood reaction.  . Sulfonamide Derivatives Nausea And Vomiting    REACTION: sensitivity        Review of Systems: Denies abdominal pain dysphagia admits to having hemorrhoidal problems  The remainder of the 10 point ROS is negative except as outlined in H&P   Physical Exam: General appearance  Well developed, in no distress. Eyes- non icteric. HEENT nontraumatic, normocephalic. Mouth no  lesions, tongue papillated, no cheilosis. Neck supple without adenopathy, thyroid not enlarged, no carotid bruits, no JVD. Lungs Clear to auscultation bilaterally. Cor normal S1, normal S2, regular rhythm, no murmur,  quiet precordium. Abdomen: Soft nontender with normoactive bowel sounds. Liver edge at costal margin. No hepatomegaly. No splenomegaly. Rectal: External hemorrhoids. Small internal hemorrhoids. Normal rectal sphincter tone. Hemoccult negative stool. Extremities no pedal edema. Skin multiple abrasions of the lower  extremities as well as upper extremities with many ecchymoses. No spider nevi. Neurological alert and oriented x 3.,resting tremor Psychological normal mood and affect.,denies excessive alcohol  Assessment and Plan:  Problem #1 alcoholism. Patient is in denial. We will start her on thiamine 100 mg daily for one week and folic acid 1 mg by mouth daily. I had another conversation with her about this.  Problem #2 Abnormal liver function tests. We will check her liver function tests as well as ammonia, prothrombin time, CBC, B12 and folate levels. We will obtain an upper abdominal ultrasound to evaluate for portal hypertension.  Problem #3 Gastroesophageal reflux. She denies dysphagia, heartburn or odynophagia. Samples of Prilosec 20 mg were given to take when necessary for heartburn and vomiting.  Problem #4 External hemorrhoids. Samples of topical steroids were given (recticare). She is up-to-date on her colonoscopy.   04/12/2012 [name]

## 2012-04-16 ENCOUNTER — Ambulatory Visit (HOSPITAL_COMMUNITY)
Admission: RE | Admit: 2012-04-16 | Discharge: 2012-04-16 | Disposition: A | Discharge status: Home / Self Care | Payer: Medicare Other | Source: Ambulatory Visit | Attending: Internal Medicine | Admitting: Internal Medicine

## 2012-04-16 DIAGNOSIS — R945 Abnormal results of liver function studies: Secondary | ICD-10-CM

## 2012-04-16 DIAGNOSIS — R7989 Other specified abnormal findings of blood chemistry: Secondary | ICD-10-CM

## 2012-04-16 DIAGNOSIS — R748 Abnormal levels of other serum enzymes: Secondary | ICD-10-CM | POA: Insufficient documentation

## 2012-04-16 DIAGNOSIS — R198 Other specified symptoms and signs involving the digestive system and abdomen: Secondary | ICD-10-CM | POA: Insufficient documentation

## 2012-04-18 ENCOUNTER — Other Ambulatory Visit: Payer: Self-pay | Admitting: *Deleted

## 2012-04-18 DIAGNOSIS — F1010 Alcohol abuse, uncomplicated: Secondary | ICD-10-CM

## 2012-04-22 ENCOUNTER — Telehealth: Payer: Self-pay | Admitting: Internal Medicine

## 2012-04-22 NOTE — Telephone Encounter (Signed)
Unable to reach patient. Phone rings but no answer or voice mail.

## 2012-04-22 NOTE — Telephone Encounter (Signed)
Spoke with patient and she is upset about the report her girls were given about the abdominal ultrasound. States she would like to talk with Dr. Juanda Chance about it.

## 2012-04-22 NOTE — Telephone Encounter (Signed)
Patient notified of Dr. Regino Schultze response.

## 2012-04-22 NOTE — Telephone Encounter (Signed)
Left a message for patient to call me. 

## 2012-04-22 NOTE — Telephone Encounter (Signed)
I have not spoken about results of her ultrasound to anybody, she is the only one given the results.

## 2012-04-24 ENCOUNTER — Telehealth: Payer: Self-pay | Admitting: Internal Medicine
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
## 2012-04-24 NOTE — Telephone Encounter (Signed)
Left a message that I will forward information to Dr. Juanda Chance.

## 2012-05-17 ENCOUNTER — Encounter: Payer: Self-pay | Admitting: Internal Medicine

## 2012-05-17 ENCOUNTER — Telehealth: Payer: Self-pay | Admitting: *Deleted

## 2012-05-17 NOTE — Telephone Encounter (Signed)
Error
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
## 2012-06-20 ENCOUNTER — Telehealth: Payer: Self-pay | Admitting: Internal Medicine

## 2012-06-20 NOTE — Telephone Encounter (Signed)
Dr Juanda Chance- Patient requests a script for omeprazole. However, it appears that we just gave her samples last time she was in the office. She is already on Protonix twice daily (per our system) from Dr Debby Bud and has been given enough medication to get her through until 02/27/13. Does she need to be on omeprazole too or were these just short term samples for GERD exacerbation?

## 2012-06-20 NOTE — Telephone Encounter (Signed)
She was not taking Protonix when I saw her, although it says so on the med list. She should not be on both. Please refill Prilosec 20 mg, it is cheaper, #30,  6 refill

## 2012-06-21 ENCOUNTER — Other Ambulatory Visit: Payer: Self-pay | Admitting: *Deleted

## 2012-06-21 DIAGNOSIS — E559 Vitamin D deficiency, unspecified: Secondary | ICD-10-CM

## 2012-06-21 MED ORDER — OMEPRAZOLE 20 MG PO CPDR: 20.0000 mg | DELAYED_RELEASE_CAPSULE | Freq: Every day | ORAL | Status: DC

## 2012-06-21 MED ORDER — LIDOCAINE (ANORECTAL) 5 % EX CREA: TOPICAL_CREAM | CUTANEOUS | Status: DC

## 2012-06-21 NOTE — Telephone Encounter (Signed)
Rx for Prilosec and recticare sent to [facility] for patient. I have discontinued the Protonix Rx (sent by Dr Debby Bud 02/28/12) @ [facility] with pharmacist, Mya.

## 2012-07-14 ENCOUNTER — Emergency Department (HOSPITAL_COMMUNITY)
Admission: EM | Admit: 2012-07-14 | Discharge: 2012-07-14 | Disposition: A | Discharge status: Home / Self Care | Payer: Medicare Other | Attending: Emergency Medicine | Admitting: Emergency Medicine

## 2012-07-14 ENCOUNTER — Encounter (HOSPITAL_COMMUNITY): Payer: Self-pay | Admitting: Emergency Medicine

## 2012-07-14 DIAGNOSIS — D500 Iron deficiency anemia secondary to blood loss (chronic): Secondary | ICD-10-CM | POA: Insufficient documentation

## 2012-07-14 DIAGNOSIS — Z8719 Personal history of other diseases of the digestive system: Secondary | ICD-10-CM | POA: Insufficient documentation

## 2012-07-14 DIAGNOSIS — Z8742 Personal history of other diseases of the female genital tract: Secondary | ICD-10-CM | POA: Insufficient documentation

## 2012-07-14 DIAGNOSIS — F329 Major depressive disorder, single episode, unspecified: Secondary | ICD-10-CM | POA: Insufficient documentation

## 2012-07-14 DIAGNOSIS — Z9849 Cataract extraction status, unspecified eye: Secondary | ICD-10-CM | POA: Insufficient documentation

## 2012-07-14 DIAGNOSIS — F411 Generalized anxiety disorder: Secondary | ICD-10-CM | POA: Insufficient documentation

## 2012-07-14 DIAGNOSIS — Z79899 Other long term (current) drug therapy: Secondary | ICD-10-CM | POA: Insufficient documentation

## 2012-07-14 DIAGNOSIS — I129 Hypertensive chronic kidney disease with stage 1 through stage 4 chronic kidney disease, or unspecified chronic kidney disease: Secondary | ICD-10-CM | POA: Insufficient documentation

## 2012-07-14 DIAGNOSIS — J029 Acute pharyngitis, unspecified: Secondary | ICD-10-CM

## 2012-07-14 DIAGNOSIS — F3289 Other specified depressive episodes: Secondary | ICD-10-CM | POA: Insufficient documentation

## 2012-07-14 DIAGNOSIS — Z8542 Personal history of malignant neoplasm of other parts of uterus: Secondary | ICD-10-CM | POA: Insufficient documentation

## 2012-07-14 DIAGNOSIS — Z8619 Personal history of other infectious and parasitic diseases: Secondary | ICD-10-CM | POA: Insufficient documentation

## 2012-07-14 DIAGNOSIS — N189 Chronic kidney disease, unspecified: Secondary | ICD-10-CM | POA: Insufficient documentation

## 2012-07-14 DIAGNOSIS — Z8739 Personal history of other diseases of the musculoskeletal system and connective tissue: Secondary | ICD-10-CM | POA: Insufficient documentation

## 2012-07-14 DIAGNOSIS — I10 Essential (primary) hypertension: Secondary | ICD-10-CM | POA: Insufficient documentation

## 2012-07-14 DIAGNOSIS — Z87891 Personal history of nicotine dependence: Secondary | ICD-10-CM | POA: Insufficient documentation

## 2012-07-14 DIAGNOSIS — Z87448 Personal history of other diseases of urinary system: Secondary | ICD-10-CM | POA: Insufficient documentation

## 2012-07-14 LAB — RAPID STREP SCREEN (MED CTR MEBANE ONLY): Streptococcus, Group A Screen (Direct): NEGATIVE

## 2012-07-14 NOTE — ED Provider Notes (Signed)
History     CSN: [id number]  Arrival date & time 07/14/12  0906   First MD Initiated Contact with Patient 07/14/12 1025      Chief Complaint  Patient presents with  . Sore Throat    (Consider location/radiation/quality/duration/timing/severity/associated sxs/prior treatment) Patient is a 67 y.o. female presenting with pharyngitis. The history is provided by the patient.  Sore Throat This is a new problem. The current episode started in the past 7 days. The problem occurs constantly. The problem has been gradually worsening. Associated symptoms include a sore throat. Pertinent negatives include no abdominal pain, change in bowel habit, chills, coughing, diaphoresis, fatigue, nausea, neck pain, numbness, rash, vomiting or weakness. Associated symptoms comments: Subjective fever. The symptoms are aggravated by coughing and swallowing. She has tried nothing for the symptoms.    Past Medical History  Diagnosis Date  . H/O: alcohol abuse 2008    28 days at [facility]  . Hepatitis, alcoholic   . History of acute pyelonephritis   . Cystitis, chronic   . Radiculopathy of sacral and sacrococcygeal region     S1-5 with weakness and paresthesia right foot  . Candidiasis of the esophagus   . Duodenal ulcer 08/15/11    multiple  . Anemia associated with acute blood loss   . Peripheral neuropathy   . Cataract   . Chronic kidney disease     RECURRENT CYSTITIS  . Uterine fibroid   . Depression   . Anxiety   . Spinal stenosis, lumbar     congenital anomalous L9 vertebra  . Ectopic pregnancy   . Hypertension   . Tubular adenoma of colon 2013    Past Surgical History  Procedure Laterality Date  . Appendectomy    . Salpingectomy      LT - ectopic pregnancy  . Lumbar fusion  05/2007    L5-6 L6-S1  . Myomectomy  09/2007    Uterine Fibroids  . Renal artery stent placement    . Esophagogastroduodenoscopy  08/15/2011    Procedure: ESOPHAGOGASTRODUODENOSCOPY (EGD);  Surgeon: Louis Meckel, MD;  Location: [facility];  Service: Endoscopy;  Laterality: N/A;  . Colonoscopy    . Oophorectomy      RSO  . Back surgery    . Tonsillectomy and adenoidectomy      Family History  Problem Relation Age of Onset  . Hypertension Mother   . Diabetes Mother   . Heart disease Father   . Dementia Father   . Diabetes Father   . Aortic aneurysm Father   . Breast cancer Paternal Aunt     40's  . Colon cancer Neg Hx   . Irritable bowel syndrome Sister   . Diabetes Sister   . Hypertension Sister   . Diabetes Brother   . Breast cancer Daughter 25    stage 1, stage 2 - 6 surgeries    History  Substance Use Topics  . Smoking status: Former Games developer  . Smokeless tobacco: Never Used  . Alcohol Use: 3.6 oz/week    6 Glasses of wine per week     Comment: wine    OB History   Grav Para Term Preterm Abortions TAB SAB Ect Mult Living   6 3 3  2   1  3       Review of Systems  Constitutional: Negative for chills, diaphoresis and fatigue.  HENT: Positive for sore throat. Negative for neck pain.   Respiratory: Negative for cough.   Gastrointestinal: Negative  for nausea, vomiting, abdominal pain and change in bowel habit.  Skin: Negative for rash.  Neurological: Negative for weakness and numbness.    Allergies  Penicillins and Sulfonamide derivatives  Home Medications   Current Outpatient Rx  Name  Route  Sig  Dispense  Refill  . alendronate (FOSAMAX) 70 MG tablet   Oral   Take 70 mg by mouth every 7 (seven) days. Takes on Sundays. Take with a full glass of water on an empty stomach.         . Cholecalciferol (VITAMIN D) 2000 UNITS CAPS   Oral   Take 2,000 Units by mouth daily.          . folic acid (FOLVITE) 1 MG tablet   Oral   Take 1 mg by mouth daily.         Marland Kitchen gabapentin (NEURONTIN) 300 MG capsule   Oral   Take 600 mg by mouth 2 (two) times daily.         Marland Kitchen LORazepam (ATIVAN) 0.5 MG tablet   Oral   Take 0.5 mg by mouth 2 (two) times daily as  needed for anxiety.         Marland Kitchen losartan (COZAAR) 100 MG tablet   Oral   Take 100 mg by mouth daily.         . Multiple Vitamin (MULITIVITAMIN WITH MINERALS) TABS   Oral   Take 1 tablet by mouth daily.         Marland Kitchen omeprazole (PRILOSEC) 20 MG capsule   Oral   Take 20 mg by mouth daily.         . sertraline (ZOLOFT) 100 MG tablet   Oral   Take 200 mg by mouth daily.         Marland Kitchen thiamine 100 MG tablet   Oral   Take 100 mg by mouth daily.         . traMADol (ULTRAM) 50 MG tablet   Oral   Take 50 mg by mouth every 6 (six) hours as needed for pain.         Marland Kitchen trolamine salicylate (ANALGESIC CREAM/ALOE) 10 % cream   Topical   Apply 1 application topically daily as needed (For right leg pain.).            BP 179/115  Pulse 84  Temp(Src) 98.9 F (37.2 C) (Oral)  Resp 18  SpO2 94%  Physical Exam  Nursing note and vitals reviewed. Constitutional: She is oriented to person, place, and time. She appears well-developed and well-nourished. No distress.  HENT:  Head: Normocephalic and atraumatic. No trismus in the jaw.  Right Ear: Tympanic membrane normal.  Left Ear: Tympanic membrane normal.  Nose: No rhinorrhea or sinus tenderness. Right sinus exhibits no maxillary sinus tenderness and no frontal sinus tenderness. Left sinus exhibits no maxillary sinus tenderness and no frontal sinus tenderness.  Mouth/Throat: Posterior oropharyngeal erythema present. No oropharyngeal exudate, posterior oropharyngeal edema or tonsillar abscesses.  Eyes: Conjunctivae and EOM are normal.  Neck: Normal range of motion. Neck supple.  No meningeal signs  Cardiovascular: Normal rate, regular rhythm and normal heart sounds.  Exam reveals no gallop and no friction rub.   No murmur heard. Pulmonary/Chest: Effort normal and breath sounds normal. No respiratory distress. She has no wheezes. She has no rales. She exhibits no tenderness.  Abdominal: Soft. Bowel sounds are normal. She exhibits no  distension. There is no tenderness. There is no rebound and no guarding.  Musculoskeletal: Normal range of motion. She exhibits no edema and no tenderness.  Neurological: She is alert and oriented to person, place, and time. No cranial nerve deficit.  Skin: Skin is warm and dry. She is not diaphoretic. No erythema.    ED Course  Procedures (including critical care time)  Labs Reviewed  RAPID STREP SCREEN   No results found.   1. Viral pharyngitis   2. Hypertension       MDM  Pt afebrile without tonsillar exudate, negative strep. Presents with mild cervical lymphadenopathy, & dysphagia; diagnosis of viral pharyngitis. No abx indicated. DC w symptomatic tx for pain  Pt does not appear dehydrated, but did discuss importance of water rehydration. Presentation non concerning for PTA or infxn spread to soft tissue. No trismus or uvula deviation. Specific return precautions discussed. Pt able to drink water in ED without difficulty with intact air way. Recommended PCP follow up.  Pt BP was 179/115 prior to discharge. Pt states she did not take her hypertension meds or anxiety meds today. Discussed with Dr. Karma Ganja. Discussed importance of compliance and following up regarding her hypertension with her primary care doctor. Will include hypertension information in discharge packet. Pt understood and was agreeable to discharge.  Glade Nurse, PA-C 07/14/12 1631

## 2012-07-14 NOTE — ED Notes (Signed)
Pt c/o sore throat x 4 days with fever

## 2012-07-15 NOTE — ED Provider Notes (Signed)
Medical screening examination/treatment/procedure(s) were performed by non-physician practitioner and as supervising physician I was immediately available for consultation/collaboration.  Nickolus Wadding K Linker, MD 07/15/12 0914 

## 2012-07-16 ENCOUNTER — Telehealth: Payer: Self-pay | Admitting: *Deleted

## 2012-07-16 NOTE — Telephone Encounter (Signed)
Message copied by Daphine Deutscher on Tue Jul 16, 2012  9:52 AM ------      Message from: Daphine Deutscher      Created: Thu Apr 18, 2012 10:30 AM       Call and remind patient due for LFT for DB on 07/15/12 ------

## 2012-07-16 NOTE — Telephone Encounter (Signed)
Left a message for patient to call me. 

## 2012-07-16 NOTE — Telephone Encounter (Signed)
Spoke with patient and reminded her of labs due.

## 2012-07-24 NOTE — Telephone Encounter (Signed)
Left another message to remind patient to have labs completed this week.

## 2012-08-05 ENCOUNTER — Encounter: Payer: Self-pay | Admitting: *Deleted

## 2012-08-07 ENCOUNTER — Other Ambulatory Visit: Payer: Medicare Other

## 2012-08-07 ENCOUNTER — Other Ambulatory Visit: Payer: Self-pay | Admitting: Gynecology

## 2012-08-07 DIAGNOSIS — F1010 Alcohol abuse, uncomplicated: Secondary | ICD-10-CM

## 2012-08-07 DIAGNOSIS — E559 Vitamin D deficiency, unspecified: Secondary | ICD-10-CM

## 2012-08-07 LAB — HEPATIC FUNCTION PANEL
ALT: 14 U/L (ref 0–35)
AST: 28 U/L (ref 0–37)
Alkaline Phosphatase: 51 U/L (ref 39–117)
Bilirubin, Direct: 0.1 mg/dL (ref 0.0–0.3)
Indirect Bilirubin: 0.5 mg/dL (ref 0.0–0.9)
Total Bilirubin: 0.6 mg/dL (ref 0.3–1.2)

## 2012-08-13 ENCOUNTER — Other Ambulatory Visit: Payer: Medicare Other

## 2012-08-14 ENCOUNTER — Telehealth: Payer: Self-pay | Admitting: Internal Medicine

## 2012-08-14 ENCOUNTER — Other Ambulatory Visit: Payer: Self-pay | Admitting: *Deleted

## 2012-08-14 DIAGNOSIS — R7989 Other specified abnormal findings of blood chemistry: Secondary | ICD-10-CM

## 2012-08-14 DIAGNOSIS — R945 Abnormal results of liver function studies: Secondary | ICD-10-CM

## 2012-08-14 NOTE — Telephone Encounter (Signed)
Please call pt with completely normal LFT's this time. I am proud of her!! Repeat 6 months. ----- Message ----- From: Daphine Deutscher, RN Sent: 08/13/2012 9:26 AM To: Hart Carwin, MD Patient notified of results. Lab in EPIC.

## 2012-08-21 ENCOUNTER — Telehealth: Payer: Self-pay | Admitting: Internal Medicine

## 2012-08-26 ENCOUNTER — Telehealth: Payer: Self-pay | Admitting: *Deleted

## 2012-08-26 NOTE — Telephone Encounter (Signed)
I called patient back, patient has been getting prescription from Dr. Debby Bud and Dr. Juanda Chance filled prescription twice in the past. Dr. Debby Bud most recent. Left message with patient to call office back.

## 2012-08-26 NOTE — Telephone Encounter (Signed)
Patient is requesting Lorazepam refill.. Dr. Debby Bud has been doing prescription. You sent RX twice in the past. Patient is requesting that you take over the prescription not Dr. Debby Bud. Is it ok to send?

## 2012-08-26 NOTE — Telephone Encounter (Signed)
OK to refill Lorazepam .5 mg,#60, 1 po bid, 2 refills.

## 2012-08-27 MED ORDER — LORAZEPAM 0.5 MG PO TABS: 0.5000 mg | ORAL_TABLET | Freq: Two times a day (BID) | ORAL | Status: AC | PRN

## 2012-08-27 NOTE — Telephone Encounter (Signed)
Medication faxed

## 2012-09-20 ENCOUNTER — Telehealth: Payer: Self-pay | Admitting: Internal Medicine

## 2012-09-20 MED ORDER — OMEPRAZOLE 20 MG PO CPDR: 20.0000 mg | DELAYED_RELEASE_CAPSULE | Freq: Every day | ORAL | Status: AC

## 2012-09-20 MED ORDER — HYDROCORTISONE ACE-PRAMOXINE 2.5-1 % RE CREA: TOPICAL_CREAM | Freq: Three times a day (TID) | RECTAL | Status: AC | PRN

## 2012-09-20 NOTE — Telephone Encounter (Signed)
Dr Brodie, please advise. 

## 2012-09-20 NOTE — Telephone Encounter (Signed)
OK, she msy hsve refills on Prilosec 20mg , #90, 1 po qd, 3 refills , and Analpram 2.5 %, 30gm, apply tid prn rectal pain

## 2012-09-20 NOTE — Telephone Encounter (Signed)
Pt called she is requesting a refill of omeprazole (PRILOSEC) 20 MG and she was given samples of analpram hc and would like a prescription for that. She has moved to [place] and will not be returning, and would like Dr. Juanda Chance to refer her to a PCP out there. Fax scripts to [facility] [phone]. She would like confirmation of it being done since she isn't living in town.

## 2012-09-20 NOTE — Telephone Encounter (Signed)
Omeprazole and analpram have been sent to [facility] per patient request. Per Dr Juanda Chance, she is unfamiliar with any PCP in [place] and is unable to recommend anyone. Patient has been advised.

## 2012-10-24 ENCOUNTER — Telehealth: Payer: Self-pay | Admitting: Internal Medicine

## 2012-10-24 NOTE — Telephone Encounter (Signed)
Per Lyla Son @ [facility], omeprazole was shipped to patient's address on 10/02/12. There is also a script on file for #90 with 3 refills. Lyla Son states that patient called this morning to inquire about pantoprazole and was told that that prescription had been discontinued by her Dr. This is correct as per 06/20/12 phone note. Patient was taking pantoprazole and omeprazole and Dr Juanda Chance asked that the pantoprazole be discontinued and omeprazole be continued since patient did not need 2 ppi's. I have left a voicemail explaining this to patient and have advised that she contact us again should she still have questions.

## 2012-12-02 ENCOUNTER — Other Ambulatory Visit: Payer: Self-pay

## 2012-12-02 MED ORDER — TRAMADOL HCL 50 MG PO TABS: 50.0000 mg | ORAL_TABLET | Freq: Four times a day (QID) | ORAL | Status: DC | PRN

## 2012-12-02 NOTE — Telephone Encounter (Signed)
Tramadol script faxed to [facility] 

## 2012-12-02 NOTE — Telephone Encounter (Signed)
Received a fax from [facility] for a refill on this medication.

## 2013-01-28 ENCOUNTER — Other Ambulatory Visit: Payer: Self-pay | Admitting: Internal Medicine

## 2013-01-28 MED ORDER — TRAMADOL HCL 50 MG PO TABS: 50.0000 mg | ORAL_TABLET | Freq: Four times a day (QID) | ORAL | Status: DC | PRN

## 2013-01-28 NOTE — Telephone Encounter (Signed)
Tramadol script to be faxed to [facility] [phone]

## 2013-01-28 NOTE — Telephone Encounter (Signed)
RX'S SENT AND TRAMADOL SCRIPT FAXED TO [facility]

## 2013-01-28 NOTE — Telephone Encounter (Signed)
Ok for 2 months refills - last OV 2/13 - needs f/u OV  Before additional refills.

## 2013-02-13 ENCOUNTER — Telehealth: Payer: Self-pay | Admitting: *Deleted

## 2013-02-13 NOTE — Telephone Encounter (Signed)
Spoke with Misty Stanley (daughter) and she has moved to [place] to live with her daughter. Patient sold her house and moved there. Daughter states she is going to get a doctor there. She will have patient call us when she finds a doctor.

## 2013-02-13 NOTE — Telephone Encounter (Signed)
Message copied by Daphine Deutscher on Thu Feb 13, 2013  8:35 AM ------      Message from: Daphine Deutscher      Created: Wed Aug 14, 2012 10:06 AM       Call and remind due for LFT on 02/17/13 DB ------

## 2013-03-05 ENCOUNTER — Telehealth: Payer: Self-pay

## 2013-03-05 NOTE — Telephone Encounter (Signed)
Phone call from [facility] [phone] stating patient is requesting a prescription for Tramadol since she spilled some of her other prescription in the toilet. [facility] not [facility] this since June of 2013. Patient currently lives in [place]. If approved they would mail this prescription the one time for her. Please advise.

## 2013-03-06 MED ORDER — TRAMADOL HCL 50 MG PO TABS: 50.0000 mg | ORAL_TABLET | Freq: Four times a day (QID) | ORAL | Status: AC | PRN

## 2013-03-06 NOTE — Telephone Encounter (Signed)
Script was called to [facility]

## 2013-03-06 NOTE — Telephone Encounter (Signed)
ok 

## 2013-04-17 ENCOUNTER — Other Ambulatory Visit: Payer: Self-pay | Admitting: Internal Medicine

## 2013-05-06 ENCOUNTER — Telehealth: Payer: Self-pay | Admitting: *Deleted

## 2013-05-06 NOTE — Telephone Encounter (Signed)
Patient phoned requesting refills and a PCP referral for someone in [place], [place].  Patient has not seen PCP since 12/2010.    States she needs refills for losartan 100 mg and zoloft 100 mg.  She has been in [place] for the past 6 months and is now in [place] caring for her mother.  Requests a recommendation/referral for a PCP in [place].

## 2013-05-06 NOTE — Telephone Encounter (Signed)
[facility] for refill on losartan and zoloft - 30 day supply. Don't know PCP in [place]. Might want to look into [facility].

## 2013-05-07 MED ORDER — LOSARTAN POTASSIUM 100 MG PO TABS: ORAL_TABLET | ORAL | Status: AC

## 2013-05-07 MED ORDER — SERTRALINE HCL 100 MG PO TABS: ORAL_TABLET | ORAL | Status: AC

## 2013-05-07 NOTE — Telephone Encounter (Signed)
Notified patient of MD response and confirmed clarification with patient that only a 30 day supply was being sent to [facility].  Appreciation and understanding verbalized.

## 2013-06-26 ENCOUNTER — Other Ambulatory Visit: Payer: Self-pay | Admitting: Obstetrics and Gynecology

## 2013-07-08 ENCOUNTER — Other Ambulatory Visit: Payer: Self-pay | Admitting: Obstetrics and Gynecology

## 2014-01-26 ENCOUNTER — Encounter (HOSPITAL_COMMUNITY): Payer: Self-pay | Admitting: Emergency Medicine

## 2014-04-18 IMAGING — US US ABDOMEN COMPLETE
1 series · 14 of 25 positions shown · non-contrast
Comparison: April 12, 2006

Abdominal ultrasound
HISTORY: Abnormal liver enzymes

[Series 1: us abdomen complete · 0.28mm/px · 14 of 79 slices shown]
[im 1/79]
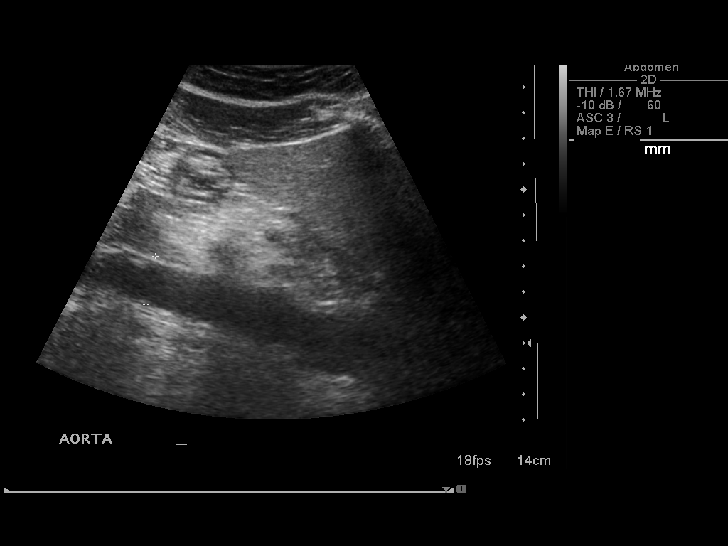
[im 7/79]
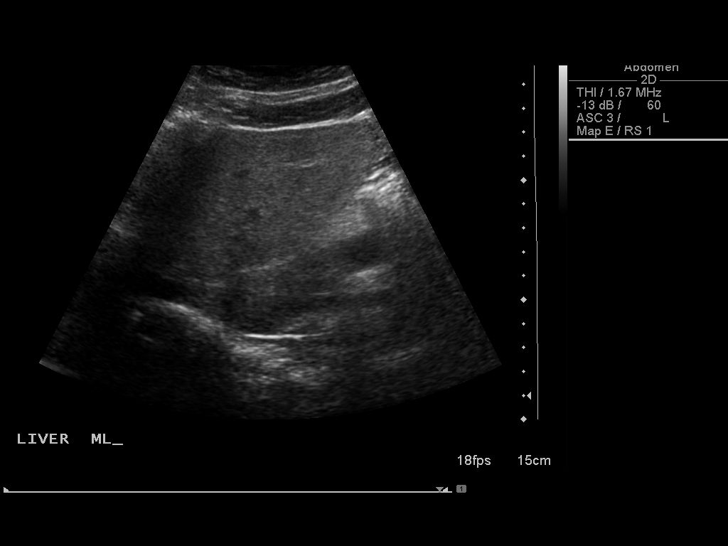
[im 14/79]
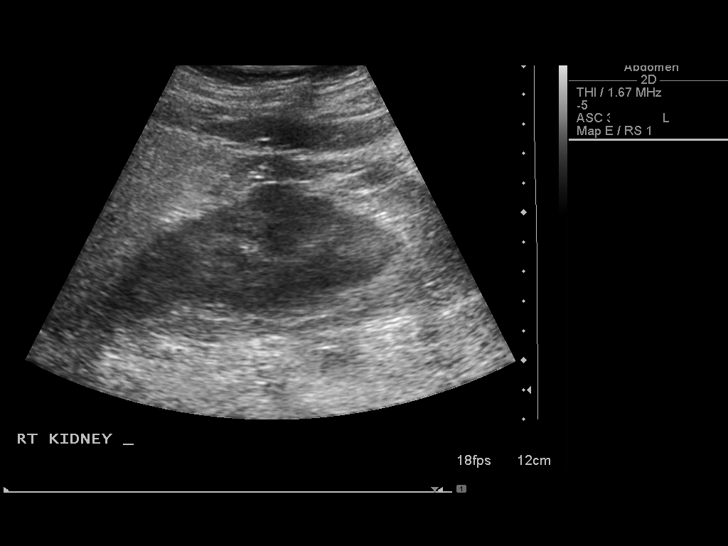
[im 20/79]
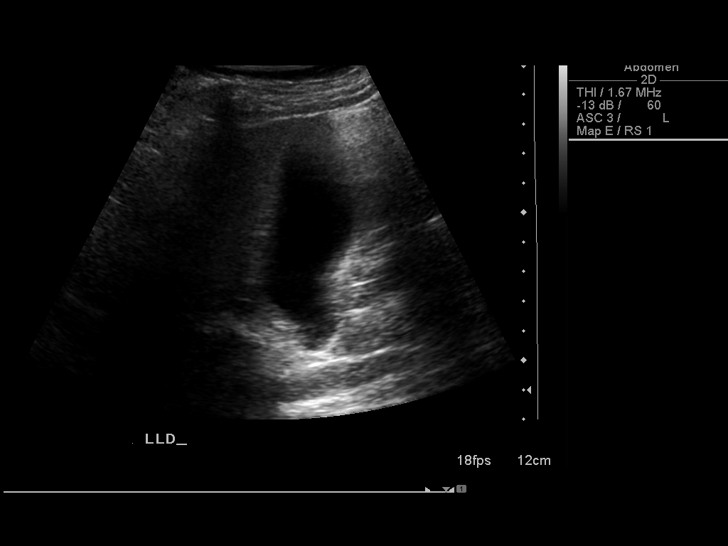
[im 27/79]
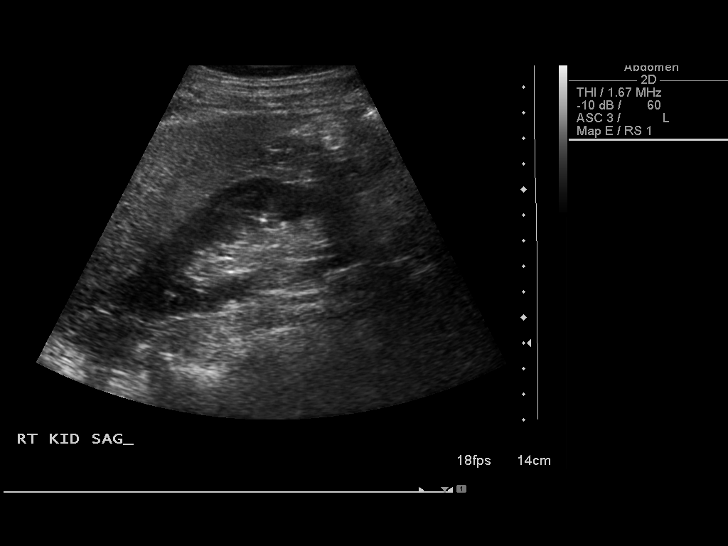
[im 30/79]
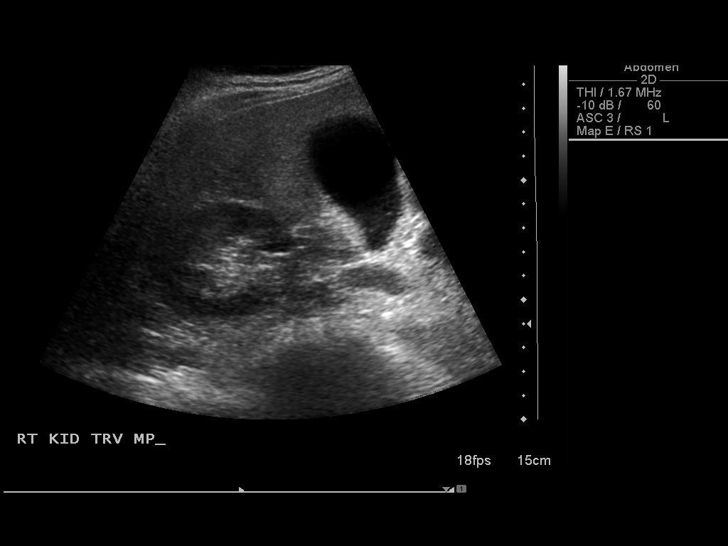
[im 36/79]
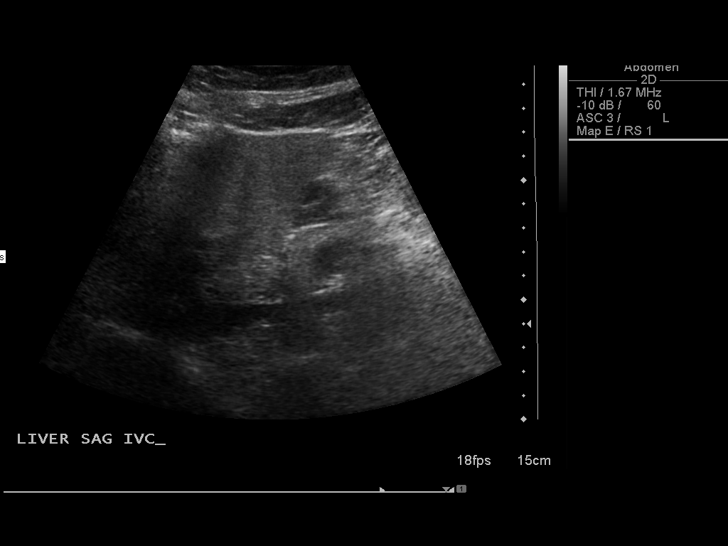
[im 43/79]
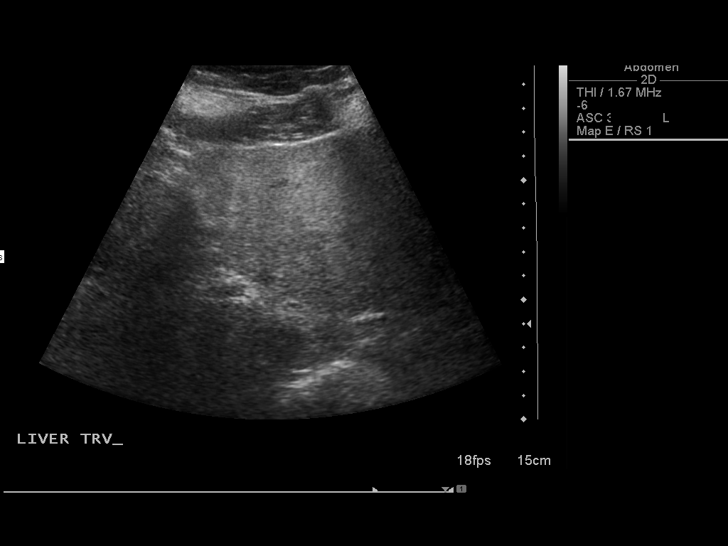
[im 49/79]
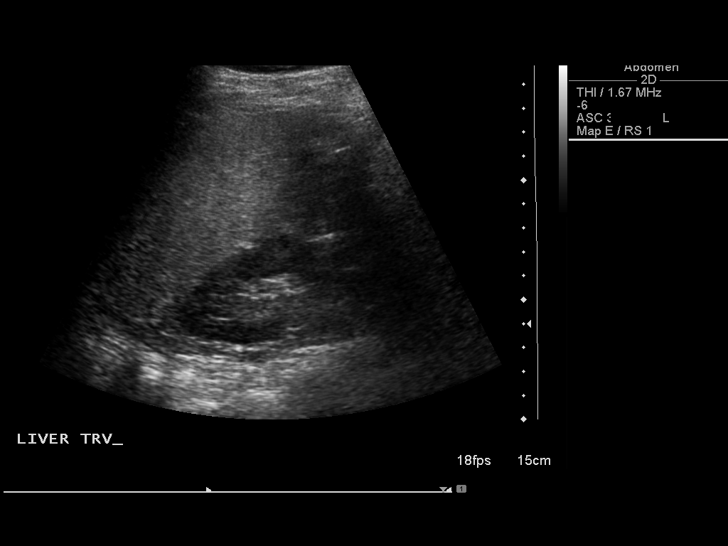
[im 53/79]
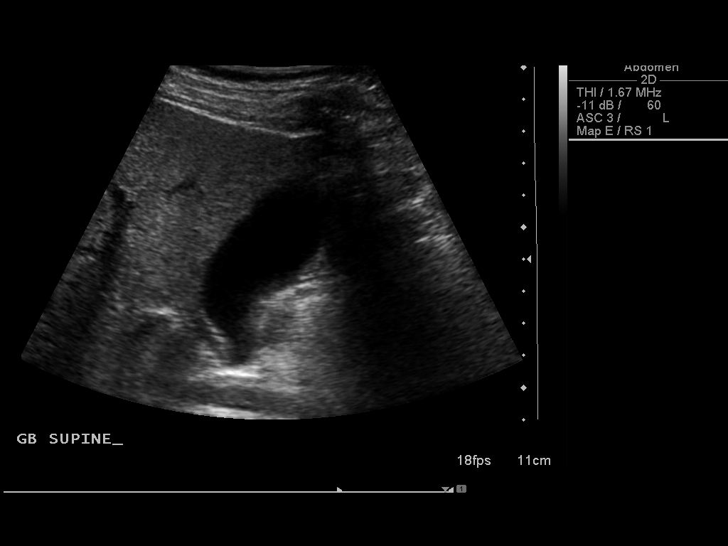
[im 59/79]
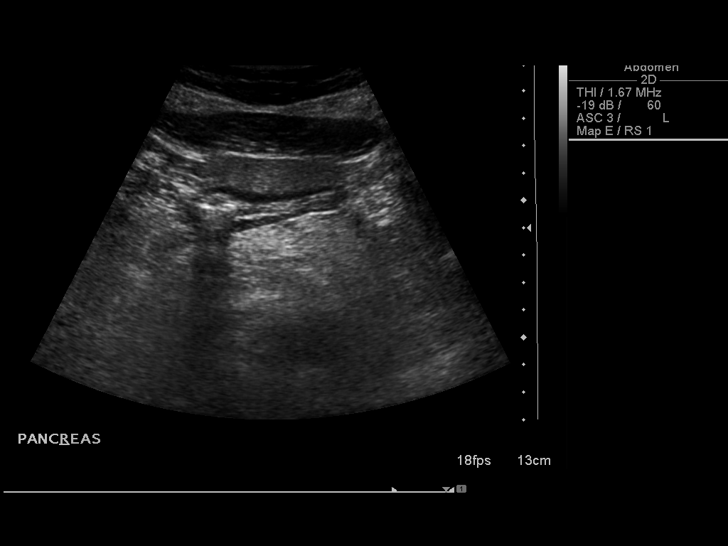
[im 66/79]
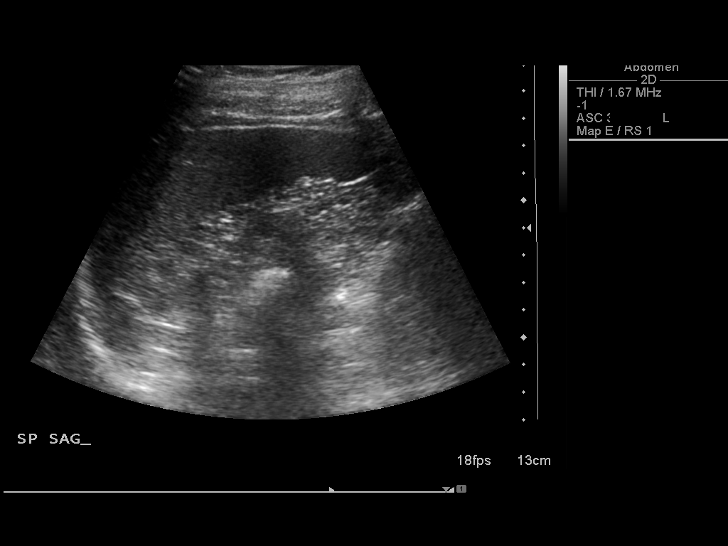
[im 72/79]
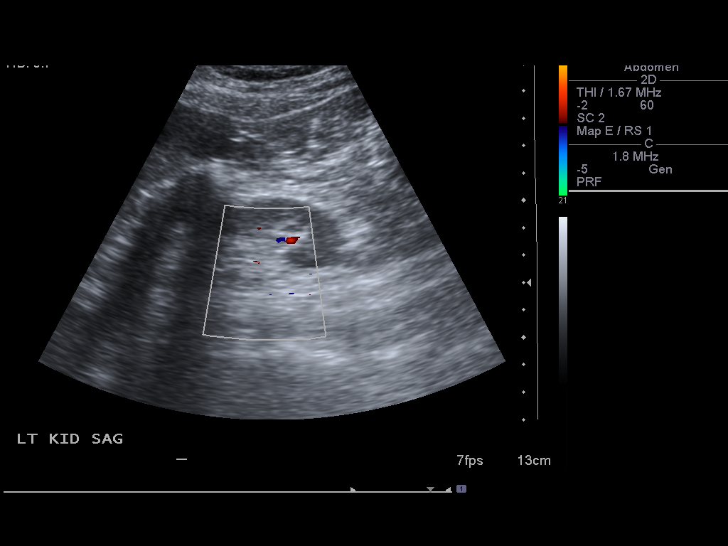
[im 79/79]
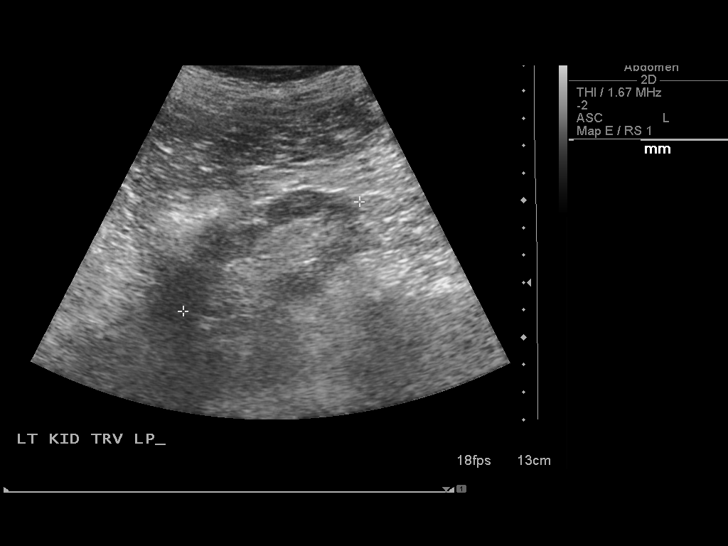

[14 of 25 positions shown; findings below may reference images not displayed]

FINDINGS: Gallbladder is visualized in multiple projections.  There
are no gallstones, gallbladder wall thickening, or pericholecystic
fluid collection.  There is no intrahepatic, common hepatic, or
common bile duct dilatation. Pancreas appears normal.

There is fatty change in the liver.  No focal liver lesions are
identified.

Spleen is normal in size and homogeneous in echotexture.  Right
kidney appears normal.  Left kidney is small measuring 7.6 cm in
length.  This size discrepancy between the kidneys has been noted
previously.  There are no renal masses or obstructing foci in
either kidney.

There is no ascites.  Aorta is nonaneurysmal.  Inferior vena cava
appears normal.
CONCLUSION: Size discrepancy between the kidneys is again noted.
This finding could indicate renal artery stenosis on the left.
Question whether patient is hypertensive in this regard.

There is some fatty change in the liver.  No focal liver lesions
are identified.  The presence of fatty change does diminish
sensitivity of ultrasound for more subtle liver lesions.

Study otherwise unremarkable.

## 2014-04-26 ENCOUNTER — Other Ambulatory Visit: Payer: Self-pay | Admitting: Internal Medicine

## 2014-08-26 ENCOUNTER — Encounter: Payer: Self-pay | Admitting: Internal Medicine
